# Patient Record
Sex: Male | Born: 1955
Health system: Southern US, Community
[De-identification: ages and names within clinical notes are randomized; demographics above are authoritative.]

## PROBLEM LIST (undated history)

## (undated) DIAGNOSIS — K7460 Unspecified cirrhosis of liver: Secondary | ICD-10-CM

## (undated) DIAGNOSIS — M4302 Spondylolysis, cervical region: Secondary | ICD-10-CM

## (undated) DIAGNOSIS — J449 Chronic obstructive pulmonary disease, unspecified: Secondary | ICD-10-CM

## (undated) DIAGNOSIS — I4891 Unspecified atrial fibrillation: Secondary | ICD-10-CM

## (undated) DIAGNOSIS — G4733 Obstructive sleep apnea (adult) (pediatric): Secondary | ICD-10-CM

## (undated) DIAGNOSIS — I420 Dilated cardiomyopathy: Secondary | ICD-10-CM

## (undated) DIAGNOSIS — I428 Other cardiomyopathies: Secondary | ICD-10-CM

## (undated) DIAGNOSIS — M170 Bilateral primary osteoarthritis of knee: Secondary | ICD-10-CM

## (undated) DIAGNOSIS — I10 Essential (primary) hypertension: Secondary | ICD-10-CM

## (undated) HISTORY — DX: Morbid (severe) obesity due to excess calories: E66.01

## (undated) HISTORY — DX: Chronic obstructive pulmonary disease, unspecified: J44.9

## (undated) HISTORY — DX: Dilated cardiomyopathy: I42.0

## (undated) HISTORY — PX: ICD IMPLANT: EP1208

## (undated) HISTORY — DX: Spondylolysis, cervical region: M43.02

## (undated) HISTORY — DX: Obstructive sleep apnea (adult) (pediatric): G47.33

## (undated) HISTORY — DX: Other cardiomyopathies: I42.8

## (undated) HISTORY — DX: Unspecified atrial fibrillation: I48.91

## (undated) HISTORY — DX: Essential (primary) hypertension: I10

## (undated) HISTORY — DX: Bilateral primary osteoarthritis of knee: M17.0

## (undated) HISTORY — DX: Unspecified cirrhosis of liver: K74.60

---

## 2016-07-20 ENCOUNTER — Institutional Professional Consult (permissible substitution): Payer: Self-pay | Admitting: Emergency Medicine

## 2016-07-21 ENCOUNTER — Encounter: Payer: Self-pay | Admitting: *Deleted

## 2016-07-21 ENCOUNTER — Ambulatory Visit: Payer: Self-pay | Admitting: Cardiovascular Disease

## 2016-08-15 ENCOUNTER — Ambulatory Visit: Payer: Self-pay | Admitting: Cardiovascular Disease

## 2016-08-24 ENCOUNTER — Institutional Professional Consult (permissible substitution): Payer: Self-pay | Admitting: Emergency Medicine

## 2016-09-12 ENCOUNTER — Ambulatory Visit (INDEPENDENT_AMBULATORY_CARE_PROVIDER_SITE_OTHER): Payer: Medicaid Other | Admitting: Cardiovascular Disease

## 2016-09-12 ENCOUNTER — Encounter: Payer: Self-pay | Admitting: Cardiovascular Disease

## 2016-09-12 VITALS — BP 138/98 | HR 78 | Ht 77.0 in | Wt 268.0 lb

## 2016-09-12 DIAGNOSIS — Z720 Tobacco use: Secondary | ICD-10-CM

## 2016-09-12 DIAGNOSIS — Z95810 Presence of automatic (implantable) cardiac defibrillator: Secondary | ICD-10-CM | POA: Diagnosis not present

## 2016-09-12 DIAGNOSIS — I5042 Chronic combined systolic (congestive) and diastolic (congestive) heart failure: Secondary | ICD-10-CM

## 2016-09-12 DIAGNOSIS — I482 Chronic atrial fibrillation, unspecified: Secondary | ICD-10-CM | POA: Insufficient documentation

## 2016-09-12 DIAGNOSIS — I420 Dilated cardiomyopathy: Secondary | ICD-10-CM | POA: Diagnosis not present

## 2016-09-12 DIAGNOSIS — I110 Hypertensive heart disease with heart failure: Secondary | ICD-10-CM | POA: Diagnosis not present

## 2016-09-12 MED ORDER — HYDRALAZINE HCL 25 MG PO TABS: 25.0000 mg | ORAL_TABLET | Freq: Three times a day (TID) | ORAL | 3 refills | Status: DC

## 2016-09-12 NOTE — Patient Instructions (Signed)
Your physician recommends that you schedule a follow-up appointment in July [place]  Your physician has recommended you make the following change in your medication: [place] 25MG  THREE TIMES A DAY   You have been referred to [facility].  Thank you for choosing [facility]!

## 2016-09-12 NOTE — Progress Notes (Signed)
CARDIOLOGY CONSULT NOTE  Patient ID: Vernon Nelson MRN: [id number] DOB/AGE: [birth year] 61 y.o.  Admit date: (Not on file) Primary Physician: Kingsley Callander, FNP Referring Physician: Eulas Post  Reason for Consultation: Atrial fibrillation, dilated cardiomyopathy, chronic systolic heart failure, ICD  HPI: The patient is a 61 year old male who has been referred by his PCP for the evaluation of atrial fibrillation, dilated cardiomyopathy, hypertension, and chronic systolic heart failure.  He has an ICD.  He has chronic exertional dyspnea which appears to be stable which is due to COPD and chronic systolic heart failure. He denies orthopnea and paroxysmal nocturnal dyspnea. He weighs himself daily and he averages 268 lbs. No recent leg swelling.  With regards to atrial fibrillation, he denies palpitations and chest discomfort.   I personally reviewed older ECGs and office visits as well as cardiovascular investigation reports.  Coronary angiography on 11/23/14 demonstrated normal coronary arteries.  Left ventricular systolic function had been severely reduced, LVEF 20-25%. However a follow-up echocardiogram on 06/01/16 demonstrated improvement in LVEF to 30-40%. There was restrictive diastolic physiology with severe left atrial enlargement and mild mitral and tricuspid regurgitation.  Defibrillator was implanted in October 2016.  ECG performed on 05/31/15 which I personally interpreted demonstrated rate controlled atrial fibrillation, 83 bpm.  ICD interrogation on 02/17/16 showed no high ventricular rates. Battery life was stable.  He had been followed at [facility] by Dr. Lavella Hammock.  He tells me he was hospitalized in December 2017 for congestive heart failure at [facility]. I do not have the discharge summary. He said he weighs himself daily and is normally 268 pounds. In December he had gone up to 297 pounds over  the course of 2 days. Since then he has been doing his best to avoid sodium.  He occasionally salts his eggs. He has sleep apnea and uses CPAP.   He has COPD and smokes a half pack of cigarettes daily but says he is trying to quit and was recently prescribed patches.  Denies ICD shocks.  If his weight goes up he takes extra Lasix. He has occasional lightheadedness but denies syncope.    He is planning to go on a cruise with his girlfriend and his brother and sister-in-law to [place] and [place] in May.  ECG which I ordered today and personally interpreted demonstrated rate controlled atrial fibrillation, 71 bpm, with an incomplete right bundle-branch block.  Social history: He is originally from [facility]. He moved here with his wife but then got divorced. He has children and grandchildren who live here in [place]. He then she plans to move back to [facility].   No Known Allergies  Current Outpatient Prescriptions  Medication Sig Dispense Refill  . carvedilol (COREG) 25 MG tablet Take 25 mg by mouth 2 (two) times daily.    . furosemide (LASIX) 20 MG tablet Take 20 mg by mouth at bedtime.    . furosemide (LASIX) 40 MG tablet Take 40 mg by mouth daily.    Marland Kitchen gabapentin (NEURONTIN) 800 MG tablet Take 800 mg by mouth 4 (four) times daily.    Marland Kitchen lisinopril (PRINIVIL,ZESTRIL) 40 MG tablet Take 40 mg by mouth daily.    Marland Kitchen LORazepam (ATIVAN) 1 MG tablet Take 1 mg by mouth 2 (two) times daily as needed for anxiety.    . mirtazapine (REMERON) 30 MG tablet Take 30 mg by mouth at bedtime.    . Oxycodone HCl  10 MG TABS Take 10 mg by mouth.    . oxyCODONE-acetaminophen (PERCOCET) 10-325 MG tablet Take 1 tablet by mouth 4 (four) times daily as needed for pain.    . rivaroxaban (XARELTO) 20 MG TABS tablet Take 20 mg by mouth daily.    Marland Kitchen spironolactone (ALDACTONE) 25 MG tablet Take 25 mg by mouth daily.     No current facility-administered medications for this visit.     Past Medical History:    Diagnosis Date  . Cervical spondylolysis   . Cirrhosis, non-alcoholic ([place])   . Congestive cardiomyopathy ([place])   . Osteoarthritis of both knees     Past Surgical History:  Procedure Laterality Date  . ICD IMPLANT  2015/2016   [facility] [place] DR Tami Ribas     Social History   Social History  . Marital status: Single    Spouse name: N/A  . Number of children: N/A  . Years of education: N/A   Occupational History  . Not on file.   Social History Main Topics  . Smoking status: Current Every Day Smoker    Years: 20.00    Types: Cigarettes  . Smokeless tobacco: Never Used  . Alcohol use Not on file  . Drug use: Unknown  . Sexual activity: Not on file   Other Topics Concern  . Not on file   Social History Narrative  . No narrative on file     No family history of premature CAD in 1st degree relatives.  Prior to Admission medications   Medication Sig Start Date End Date Taking? Authorizing Provider  carvedilol (COREG) 25 MG tablet Take 25 mg by mouth 2 (two) times daily.    Historical Provider, MD  furosemide (LASIX) 20 MG tablet Take 20 mg by mouth at bedtime.    Historical Provider, MD  furosemide (LASIX) 40 MG tablet Take 40 mg by mouth daily.    Historical Provider, MD  gabapentin (NEURONTIN) 800 MG tablet Take 800 mg by mouth 4 (four) times daily.    Historical Provider, MD  lisinopril (PRINIVIL,ZESTRIL) 20 MG tablet Take 20 mg by mouth daily.    Historical Provider, MD  LORazepam (ATIVAN) 1 MG tablet Take 1 mg by mouth 2 (two) times daily as needed for anxiety.    Historical Provider, MD  oxyCODONE-acetaminophen (PERCOCET) 10-325 MG tablet Take 1 tablet by mouth 4 (four) times daily as needed for pain.    Historical Provider, MD  rivaroxaban (XARELTO) 20 MG TABS tablet Take 20 mg by mouth daily.    Historical Provider, MD  spironolactone (ALDACTONE) 25 MG tablet Take 25 mg by mouth daily.    Historical Provider, MD     Review of systems complete and  found to be negative unless listed above in HPI     Physical exam Blood pressure (!) 138/98, pulse 78, height 6\' 5"  (1.956 m), weight 238 lb (108 kg), SpO2 98 %. General: NAD Neck: No JVD, no thyromegaly or thyroid nodule.  Lungs: Clear to auscultation bilaterally with normal respiratory effort. CV: Distant tones. Regular rate and irregular rhythm, normal S1/S2, no S3/S4, no murmur.  No peripheral edema.  No carotid bruit.    Abdomen: Soft, nontender, no hepatosplenomegaly, no distention.  Skin: Intact without lesions or rashes.  Neurologic: Alert and oriented x 3.  Psych: Normal affect. Extremities: No clubbing or cyanosis.  HEENT: Normal.   ECG: Most recent ECG reviewed.  Telemetry: Independently reviewed.  Labs:  No results found for: WBC, HGB,  HCT, MCV, PLT No results for input(s): NA, K, CL, CO2, BUN, CREATININE, CALCIUM, PROT, BILITOT, ALKPHOS, ALT, AST, GLUCOSE in the last 168 hours.  Invalid input(s): LABALBU No results found for: CKTOTAL, CKMB, CKMBINDEX, TROPONINI No results found for: CHOL No results found for: HDL No results found for: LDLCALC No results found for: TRIG No results found for: CHOLHDL No results found for: LDLDIRECT       Studies: No results found.  ASSESSMENT AND PLAN:  1. Chronic systolic heart failure (dilated cardiomyopathy), LVEF 30-40%: Euvolemic and stable. Continue carvedilol 25 mg twice daily and lisinopril 40 mg daily. Also continue Lasix 40 mg every morning and 20 mg every evening along with spironolactone 25 mg daily. I will order records for review regarding his hospitalization for CHF in December 2017.  2. Hypertensive heart disease: Blood pressure is markedly elevated. I will start hydralazine 25 mg three times daily. Encouraged to avoid sodium.  3. Chronic atrial fibrillation: Symptomatically stable. Continue Xarelto 20 mg daily for anticoagulation. Continue carvedilol 25 mg twice daily for rate control. ECG reviewed above.  4.  Defibrillator: Stable. I will enroll in device clinic for routine surveillance monitoring. Most recent interrogation reviewed above with ICD found to be stable with normal battery life.  5. Sleep apnea: Uses CPAP.  6. Tobacco abuse: Trying to quit. Using patches now.   Dispo: fu July 2018.  Signed: Kate Sable, M.D., F.A.C.C.  09/12/2016, 1:52 PM

## 2016-09-14 ENCOUNTER — Institutional Professional Consult (permissible substitution): Payer: Self-pay | Admitting: Pulmonary Disease

## 2016-09-19 ENCOUNTER — Encounter: Payer: Self-pay | Admitting: Internal Medicine

## 2016-10-04 ENCOUNTER — Encounter: Payer: Medicaid Other | Admitting: Internal Medicine

## 2016-10-23 ENCOUNTER — Institutional Professional Consult (permissible substitution): Payer: Self-pay | Admitting: Internal Medicine

## 2016-11-20 ENCOUNTER — Encounter: Payer: Medicaid Other | Admitting: Internal Medicine

## 2016-11-20 ENCOUNTER — Institutional Professional Consult (permissible substitution): Payer: Self-pay | Admitting: Internal Medicine

## 2016-11-21 ENCOUNTER — Institutional Professional Consult (permissible substitution): Payer: Self-pay | Admitting: Internal Medicine

## 2016-11-30 ENCOUNTER — Encounter: Payer: Medicaid Other | Admitting: Internal Medicine

## 2016-12-13 ENCOUNTER — Ambulatory Visit (INDEPENDENT_AMBULATORY_CARE_PROVIDER_SITE_OTHER): Payer: Medicaid Other | Admitting: Cardiovascular Disease

## 2016-12-13 ENCOUNTER — Encounter: Payer: Self-pay | Admitting: Cardiovascular Disease

## 2016-12-13 VITALS — BP 138/100 | HR 60 | Ht 77.0 in | Wt 274.0 lb

## 2016-12-13 DIAGNOSIS — I420 Dilated cardiomyopathy: Secondary | ICD-10-CM | POA: Diagnosis not present

## 2016-12-13 DIAGNOSIS — Z720 Tobacco use: Secondary | ICD-10-CM

## 2016-12-13 DIAGNOSIS — I482 Chronic atrial fibrillation, unspecified: Secondary | ICD-10-CM

## 2016-12-13 DIAGNOSIS — I5042 Chronic combined systolic (congestive) and diastolic (congestive) heart failure: Secondary | ICD-10-CM

## 2016-12-13 DIAGNOSIS — Z95810 Presence of automatic (implantable) cardiac defibrillator: Secondary | ICD-10-CM | POA: Diagnosis not present

## 2016-12-13 DIAGNOSIS — I110 Hypertensive heart disease with heart failure: Secondary | ICD-10-CM | POA: Diagnosis not present

## 2016-12-13 MED ORDER — HYDRALAZINE HCL 50 MG PO TABS: 50.0000 mg | ORAL_TABLET | Freq: Three times a day (TID) | ORAL | 3 refills | Status: DC

## 2016-12-13 NOTE — Patient Instructions (Signed)
Medication Instructions:  Your physician has recommended you make the following change in your medication:  Begin taking hydralazine 50 mg Three times a day   Labwork: NONE  Testing/Procedures: NONE  Follow-Up: Your physician wants you to follow-up in: [place]. [place]. You will receive a reminder letter in the mail two months in advance. If you don't receive a letter, please call our office to schedule the follow-up appointment.  Any Other Special Instructions Will Be Listed Below (If Applicable).  If you need a refill on your cardiac medications before your next appointment, please call your pharmacy.

## 2016-12-13 NOTE — Progress Notes (Signed)
SUBJECTIVE: The patient returns for follow-up of dilated cardiomyopathy and chronic systolic heart failure with hypertensive heart disease. He also has chronic atrial fibrillation and has a defibrillator.  He recently returned from an [place]. He was eating a lot of sodium rich foods and developed leg swelling and had to take extra Lasix.  Today he denies chest pain, dizziness, shortness of breath, orthopnea, and PND. He denies ICD shocks.  He sometimes forgets to take hydralazine. Blood pressure is 138/100 today.     Social history: He is originally from [facility]. He moved here with his wife but then got divorced. He has children and grandchildren who live here in [place]. He is considering moving back to [facility].  Review of Systems: As per "subjective", otherwise negative.  No Known Allergies  Current Outpatient Prescriptions  Medication Sig Dispense Refill  . carvedilol (COREG) 25 MG tablet Take 25 mg by mouth 2 (two) times daily.    . diazepam (VALIUM) 10 MG tablet Take 10 mg by mouth 2 (two) times daily.    . DULoxetine (CYMBALTA) 30 MG capsule Take 30 mg by mouth 2 (two) times daily.    . furosemide (LASIX) 20 MG tablet Take 20 mg by mouth at bedtime.    . furosemide (LASIX) 40 MG tablet Take 40 mg by mouth daily.    Marland Kitchen gabapentin (NEURONTIN) 600 MG tablet Take 600 mg by mouth 3 (three) times daily.    Marland Kitchen lisinopril (PRINIVIL,ZESTRIL) 40 MG tablet Take 40 mg by mouth daily.    . mirtazapine (REMERON) 30 MG tablet Take 30 mg by mouth at bedtime.    . Oxycodone HCl 10 MG TABS Take 10 mg by mouth.    . oxyCODONE-acetaminophen (PERCOCET) 10-325 MG tablet Take 1 tablet by mouth 4 (four) times daily as needed for pain.    . rivaroxaban (XARELTO) 20 MG TABS tablet Take 20 mg by mouth daily.    Marland Kitchen spironolactone (ALDACTONE) 25 MG tablet Take 25 mg by mouth daily.    . hydrALAZINE (APRESOLINE) 25 MG tablet Take 1 tablet (25 mg total) by mouth 3 (three) times daily.  270 tablet 3   No current facility-administered medications for this visit.     Past Medical History:  Diagnosis Date  . Cervical spondylolysis   . Cirrhosis, non-alcoholic ([place])   . Congestive cardiomyopathy ([place])   . Osteoarthritis of both knees     Past Surgical History:  Procedure Laterality Date  . ICD IMPLANT  2015/2016   [facility] [place] DR Tami Ribas     Social History   Social History  . Marital status: Single    Spouse name: N/A  . Number of children: N/A  . Years of education: N/A   Occupational History  . Not on file.   Social History Main Topics  . Smoking status: Current Every Day Smoker    Years: 20.00    Types: Cigarettes  . Smokeless tobacco: Never Used  . Alcohol use No  . Drug use: No  . Sexual activity: Not on file   Other Topics Concern  . Not on file   Social History Narrative  . No narrative on file     Vitals:   12/13/16 1508  BP: (!) 138/100  Pulse: 60  SpO2: 97%  Weight: 274 lb (124.3 kg)  Height: 6\' 5"  (1.956 m)    Wt Readings from Last 3 Encounters:  12/13/16 274 lb (124.3 kg)  09/12/16  268 lb (121.6 kg)     PHYSICAL EXAM General: NAD HEENT: Normal. Neck: No JVD, no thyromegaly. Lungs: Clear to auscultation bilaterally with normal respiratory effort. CV: Nondisplaced PMI.  Regular rate and rhythm, normal S1/S2, no S3/S4, no murmur. No pretibial or periankle edema.  No carotid bruit.   Abdomen: Soft, protuberant.  Neurologic: Alert and oriented.  Psych: Normal affect. Skin: Normal. Musculoskeletal: No gross deformities.    ECG: Most recent ECG reviewed.   Labs: No results found for: K, BUN, CREATININE, ALT, TSH, HGB   Lipids: No results found for: LDLCALC, LDLDIRECT, CHOL, TRIG, HDL     ASSESSMENT AND PLAN: 1. Chronic systolic heart failure (dilated cardiomyopathy), LVEF 30-40%: Euvolemic. Continue carvedilol, lisinopril, spironolactone, hydralazine (increase to 50 mg tid), and Lasix. I will aim to  control blood pressure.  2. Hypertensive heart disease: Remains elevated. I will increase hydralazine to 50 mg three times daily. Blood pressure is markedly elevated. I will start hydralazine 25 mg three times daily. Encouraged to avoid sodium.  3. Chronic atrial fibrillation: Symptomatically stable on carvedilol. Continue Xarelto.  4. Defibrillator: No ICD shocks. Follows with Dr. Rayann Heman.  5. Sleep apnea: Uses CPAP.  6. Tobacco abuse: Trying to quit. Using patches now.    Disposition: Follow up 4 months  Kate Sable, M.D., F.A.C.C.

## 2017-02-07 DIAGNOSIS — M545 Low back pain: Secondary | ICD-10-CM | POA: Diagnosis not present

## 2017-02-07 DIAGNOSIS — M130 Polyarthritis, unspecified: Secondary | ICD-10-CM | POA: Diagnosis not present

## 2017-02-07 DIAGNOSIS — G4733 Obstructive sleep apnea (adult) (pediatric): Secondary | ICD-10-CM | POA: Diagnosis not present

## 2017-02-07 DIAGNOSIS — Z79891 Long term (current) use of opiate analgesic: Secondary | ICD-10-CM | POA: Diagnosis not present

## 2017-02-07 DIAGNOSIS — G4701 Insomnia due to medical condition: Secondary | ICD-10-CM | POA: Diagnosis not present

## 2017-02-07 DIAGNOSIS — M542 Cervicalgia: Secondary | ICD-10-CM | POA: Diagnosis not present

## 2017-02-07 DIAGNOSIS — I482 Chronic atrial fibrillation: Secondary | ICD-10-CM | POA: Diagnosis not present

## 2017-02-07 DIAGNOSIS — I5022 Chronic systolic (congestive) heart failure: Secondary | ICD-10-CM | POA: Diagnosis not present

## 2017-02-21 ENCOUNTER — Encounter: Payer: Self-pay | Admitting: Internal Medicine

## 2017-02-22 ENCOUNTER — Encounter: Payer: Self-pay | Admitting: Internal Medicine

## 2017-03-06 DIAGNOSIS — I5022 Chronic systolic (congestive) heart failure: Secondary | ICD-10-CM | POA: Diagnosis not present

## 2017-03-06 DIAGNOSIS — F5221 Male erectile disorder: Secondary | ICD-10-CM | POA: Diagnosis not present

## 2017-03-06 DIAGNOSIS — M542 Cervicalgia: Secondary | ICD-10-CM | POA: Diagnosis not present

## 2017-03-06 DIAGNOSIS — G4733 Obstructive sleep apnea (adult) (pediatric): Secondary | ICD-10-CM | POA: Diagnosis not present

## 2017-03-06 DIAGNOSIS — M130 Polyarthritis, unspecified: Secondary | ICD-10-CM | POA: Diagnosis not present

## 2017-03-06 DIAGNOSIS — M545 Low back pain: Secondary | ICD-10-CM | POA: Diagnosis not present

## 2017-03-06 DIAGNOSIS — I482 Chronic atrial fibrillation: Secondary | ICD-10-CM | POA: Diagnosis not present

## 2017-03-06 DIAGNOSIS — G4701 Insomnia due to medical condition: Secondary | ICD-10-CM | POA: Diagnosis not present

## 2017-03-21 ENCOUNTER — Ambulatory Visit: Payer: Self-pay | Admitting: Physician Assistant

## 2017-03-21 DIAGNOSIS — Z23 Encounter for immunization: Secondary | ICD-10-CM | POA: Diagnosis not present

## 2017-04-04 DIAGNOSIS — M130 Polyarthritis, unspecified: Secondary | ICD-10-CM | POA: Diagnosis not present

## 2017-04-04 DIAGNOSIS — M542 Cervicalgia: Secondary | ICD-10-CM | POA: Diagnosis not present

## 2017-04-04 DIAGNOSIS — M545 Low back pain: Secondary | ICD-10-CM | POA: Diagnosis not present

## 2017-04-04 DIAGNOSIS — G4701 Insomnia due to medical condition: Secondary | ICD-10-CM | POA: Diagnosis not present

## 2017-04-17 ENCOUNTER — Ambulatory Visit: Payer: Medicaid Other | Admitting: Cardiovascular Disease

## 2017-04-19 DIAGNOSIS — R221 Localized swelling, mass and lump, neck: Secondary | ICD-10-CM | POA: Diagnosis not present

## 2017-04-19 DIAGNOSIS — I5022 Chronic systolic (congestive) heart failure: Secondary | ICD-10-CM | POA: Diagnosis not present

## 2017-04-19 DIAGNOSIS — F5221 Male erectile disorder: Secondary | ICD-10-CM | POA: Diagnosis not present

## 2017-04-23 DIAGNOSIS — R221 Localized swelling, mass and lump, neck: Secondary | ICD-10-CM | POA: Diagnosis not present

## 2017-04-23 DIAGNOSIS — I10 Essential (primary) hypertension: Secondary | ICD-10-CM | POA: Diagnosis not present

## 2017-05-30 DIAGNOSIS — M542 Cervicalgia: Secondary | ICD-10-CM | POA: Diagnosis not present

## 2017-05-30 DIAGNOSIS — Z79899 Other long term (current) drug therapy: Secondary | ICD-10-CM | POA: Diagnosis not present

## 2017-05-30 DIAGNOSIS — G894 Chronic pain syndrome: Secondary | ICD-10-CM | POA: Diagnosis not present

## 2017-05-30 DIAGNOSIS — M545 Low back pain: Secondary | ICD-10-CM | POA: Diagnosis not present

## 2017-05-30 DIAGNOSIS — Z79891 Long term (current) use of opiate analgesic: Secondary | ICD-10-CM | POA: Diagnosis not present

## 2017-05-30 DIAGNOSIS — M130 Polyarthritis, unspecified: Secondary | ICD-10-CM | POA: Diagnosis not present

## 2017-06-06 ENCOUNTER — Ambulatory Visit: Payer: Self-pay | Admitting: Cardiovascular Disease

## 2017-07-30 DIAGNOSIS — M542 Cervicalgia: Secondary | ICD-10-CM | POA: Diagnosis not present

## 2017-07-30 DIAGNOSIS — G894 Chronic pain syndrome: Secondary | ICD-10-CM | POA: Diagnosis not present

## 2017-07-30 DIAGNOSIS — M130 Polyarthritis, unspecified: Secondary | ICD-10-CM | POA: Diagnosis not present

## 2017-07-30 DIAGNOSIS — M545 Low back pain: Secondary | ICD-10-CM | POA: Diagnosis not present

## 2017-08-02 DIAGNOSIS — I110 Hypertensive heart disease with heart failure: Secondary | ICD-10-CM | POA: Diagnosis not present

## 2017-08-02 DIAGNOSIS — I5022 Chronic systolic (congestive) heart failure: Secondary | ICD-10-CM | POA: Diagnosis not present

## 2017-08-02 DIAGNOSIS — E1121 Type 2 diabetes mellitus with diabetic nephropathy: Secondary | ICD-10-CM | POA: Diagnosis not present

## 2017-08-02 DIAGNOSIS — R3911 Hesitancy of micturition: Secondary | ICD-10-CM | POA: Diagnosis not present

## 2017-08-24 ENCOUNTER — Ambulatory Visit: Payer: Self-pay | Admitting: Cardiovascular Disease

## 2017-09-18 ENCOUNTER — Other Ambulatory Visit: Payer: Self-pay | Admitting: Cardiovascular Disease

## 2017-09-26 DIAGNOSIS — M542 Cervicalgia: Secondary | ICD-10-CM | POA: Diagnosis not present

## 2017-09-26 DIAGNOSIS — M545 Low back pain: Secondary | ICD-10-CM | POA: Diagnosis not present

## 2017-09-26 DIAGNOSIS — M130 Polyarthritis, unspecified: Secondary | ICD-10-CM | POA: Diagnosis not present

## 2017-09-26 DIAGNOSIS — G894 Chronic pain syndrome: Secondary | ICD-10-CM | POA: Diagnosis not present

## 2017-10-26 ENCOUNTER — Ambulatory Visit: Payer: Self-pay | Admitting: Cardiovascular Disease

## 2017-11-08 ENCOUNTER — Other Ambulatory Visit: Payer: Self-pay | Admitting: Cardiovascular Disease

## 2017-11-08 DIAGNOSIS — I509 Heart failure, unspecified: Secondary | ICD-10-CM | POA: Diagnosis not present

## 2017-11-08 DIAGNOSIS — M545 Low back pain: Secondary | ICD-10-CM | POA: Diagnosis not present

## 2017-11-08 DIAGNOSIS — J449 Chronic obstructive pulmonary disease, unspecified: Secondary | ICD-10-CM | POA: Diagnosis not present

## 2017-11-08 DIAGNOSIS — I10 Essential (primary) hypertension: Secondary | ICD-10-CM | POA: Diagnosis not present

## 2017-11-21 DIAGNOSIS — M545 Low back pain: Secondary | ICD-10-CM | POA: Diagnosis not present

## 2017-11-21 DIAGNOSIS — Z79891 Long term (current) use of opiate analgesic: Secondary | ICD-10-CM | POA: Diagnosis not present

## 2017-11-21 DIAGNOSIS — G894 Chronic pain syndrome: Secondary | ICD-10-CM | POA: Diagnosis not present

## 2017-11-21 DIAGNOSIS — M542 Cervicalgia: Secondary | ICD-10-CM | POA: Diagnosis not present

## 2017-11-21 DIAGNOSIS — M130 Polyarthritis, unspecified: Secondary | ICD-10-CM | POA: Diagnosis not present

## 2017-12-03 DIAGNOSIS — M25562 Pain in left knee: Secondary | ICD-10-CM | POA: Diagnosis not present

## 2017-12-03 DIAGNOSIS — L304 Erythema intertrigo: Secondary | ICD-10-CM | POA: Diagnosis not present

## 2017-12-03 DIAGNOSIS — J449 Chronic obstructive pulmonary disease, unspecified: Secondary | ICD-10-CM | POA: Diagnosis not present

## 2017-12-03 DIAGNOSIS — M25561 Pain in right knee: Secondary | ICD-10-CM | POA: Diagnosis not present

## 2017-12-25 DIAGNOSIS — M170 Bilateral primary osteoarthritis of knee: Secondary | ICD-10-CM | POA: Diagnosis not present

## 2018-01-01 ENCOUNTER — Ambulatory Visit: Payer: Self-pay | Admitting: Cardiovascular Disease

## 2018-01-23 ENCOUNTER — Ambulatory Visit: Payer: Self-pay | Admitting: Cardiovascular Disease

## 2018-02-19 DIAGNOSIS — Z79891 Long term (current) use of opiate analgesic: Secondary | ICD-10-CM | POA: Diagnosis not present

## 2018-02-19 DIAGNOSIS — M545 Low back pain: Secondary | ICD-10-CM | POA: Diagnosis not present

## 2018-02-19 DIAGNOSIS — G894 Chronic pain syndrome: Secondary | ICD-10-CM | POA: Diagnosis not present

## 2018-02-19 DIAGNOSIS — M130 Polyarthritis, unspecified: Secondary | ICD-10-CM | POA: Diagnosis not present

## 2018-02-19 DIAGNOSIS — M542 Cervicalgia: Secondary | ICD-10-CM | POA: Diagnosis not present

## 2018-03-19 DIAGNOSIS — Z23 Encounter for immunization: Secondary | ICD-10-CM | POA: Diagnosis not present

## 2018-03-20 DIAGNOSIS — M545 Low back pain: Secondary | ICD-10-CM | POA: Diagnosis not present

## 2018-03-20 DIAGNOSIS — G894 Chronic pain syndrome: Secondary | ICD-10-CM | POA: Diagnosis not present

## 2018-03-20 DIAGNOSIS — M130 Polyarthritis, unspecified: Secondary | ICD-10-CM | POA: Diagnosis not present

## 2018-03-20 DIAGNOSIS — M542 Cervicalgia: Secondary | ICD-10-CM | POA: Diagnosis not present

## 2018-03-28 ENCOUNTER — Ambulatory Visit: Payer: Self-pay | Admitting: Cardiovascular Disease

## 2018-04-16 ENCOUNTER — Other Ambulatory Visit: Payer: Self-pay | Admitting: Cardiovascular Disease

## 2018-05-13 ENCOUNTER — Other Ambulatory Visit: Payer: Self-pay | Admitting: Cardiovascular Disease

## 2018-05-14 ENCOUNTER — Other Ambulatory Visit: Payer: Self-pay | Admitting: Cardiovascular Disease

## 2018-05-14 DIAGNOSIS — M542 Cervicalgia: Secondary | ICD-10-CM | POA: Diagnosis not present

## 2018-05-14 DIAGNOSIS — M545 Low back pain: Secondary | ICD-10-CM | POA: Diagnosis not present

## 2018-05-14 DIAGNOSIS — G894 Chronic pain syndrome: Secondary | ICD-10-CM | POA: Diagnosis not present

## 2018-05-14 DIAGNOSIS — M130 Polyarthritis, unspecified: Secondary | ICD-10-CM | POA: Diagnosis not present

## 2018-07-09 DIAGNOSIS — M542 Cervicalgia: Secondary | ICD-10-CM | POA: Diagnosis not present

## 2018-07-09 DIAGNOSIS — G894 Chronic pain syndrome: Secondary | ICD-10-CM | POA: Diagnosis not present

## 2018-07-09 DIAGNOSIS — M545 Low back pain: Secondary | ICD-10-CM | POA: Diagnosis not present

## 2018-07-09 DIAGNOSIS — M130 Polyarthritis, unspecified: Secondary | ICD-10-CM | POA: Diagnosis not present

## 2018-07-18 DIAGNOSIS — R600 Localized edema: Secondary | ICD-10-CM | POA: Diagnosis not present

## 2018-07-18 DIAGNOSIS — R0602 Shortness of breath: Secondary | ICD-10-CM | POA: Diagnosis not present

## 2018-07-18 DIAGNOSIS — Z6836 Body mass index (BMI) 36.0-36.9, adult: Secondary | ICD-10-CM | POA: Diagnosis not present

## 2018-07-18 DIAGNOSIS — Z125 Encounter for screening for malignant neoplasm of prostate: Secondary | ICD-10-CM | POA: Diagnosis not present

## 2018-07-18 DIAGNOSIS — Z8619 Personal history of other infectious and parasitic diseases: Secondary | ICD-10-CM | POA: Diagnosis not present

## 2018-07-18 DIAGNOSIS — R200 Anesthesia of skin: Secondary | ICD-10-CM | POA: Diagnosis not present

## 2018-07-18 DIAGNOSIS — J449 Chronic obstructive pulmonary disease, unspecified: Secondary | ICD-10-CM | POA: Diagnosis not present

## 2018-07-18 DIAGNOSIS — Z7689 Persons encountering health services in other specified circumstances: Secondary | ICD-10-CM | POA: Diagnosis not present

## 2018-07-18 DIAGNOSIS — I5022 Chronic systolic (congestive) heart failure: Secondary | ICD-10-CM | POA: Diagnosis not present

## 2018-07-18 DIAGNOSIS — E1159 Type 2 diabetes mellitus with other circulatory complications: Secondary | ICD-10-CM | POA: Diagnosis not present

## 2018-07-18 DIAGNOSIS — Z5320 Procedure and treatment not carried out because of patient's decision for unspecified reasons: Secondary | ICD-10-CM | POA: Diagnosis not present

## 2018-07-18 DIAGNOSIS — I10 Essential (primary) hypertension: Secondary | ICD-10-CM | POA: Diagnosis not present

## 2018-07-23 DIAGNOSIS — M545 Low back pain: Secondary | ICD-10-CM | POA: Diagnosis not present

## 2018-07-23 DIAGNOSIS — E559 Vitamin D deficiency, unspecified: Secondary | ICD-10-CM | POA: Diagnosis not present

## 2018-07-23 DIAGNOSIS — D474 Osteomyelofibrosis: Secondary | ICD-10-CM | POA: Diagnosis not present

## 2018-07-23 DIAGNOSIS — M179 Osteoarthritis of knee, unspecified: Secondary | ICD-10-CM | POA: Diagnosis not present

## 2018-07-23 DIAGNOSIS — M542 Cervicalgia: Secondary | ICD-10-CM | POA: Diagnosis not present

## 2018-07-23 DIAGNOSIS — Z79891 Long term (current) use of opiate analgesic: Secondary | ICD-10-CM | POA: Diagnosis not present

## 2018-07-23 DIAGNOSIS — G894 Chronic pain syndrome: Secondary | ICD-10-CM | POA: Diagnosis not present

## 2018-07-23 DIAGNOSIS — M130 Polyarthritis, unspecified: Secondary | ICD-10-CM | POA: Diagnosis not present

## 2018-07-23 DIAGNOSIS — G602 Neuropathy in association with hereditary ataxia: Secondary | ICD-10-CM | POA: Diagnosis not present

## 2018-08-06 DIAGNOSIS — G894 Chronic pain syndrome: Secondary | ICD-10-CM | POA: Diagnosis not present

## 2018-08-06 DIAGNOSIS — M130 Polyarthritis, unspecified: Secondary | ICD-10-CM | POA: Diagnosis not present

## 2018-08-06 DIAGNOSIS — M542 Cervicalgia: Secondary | ICD-10-CM | POA: Diagnosis not present

## 2018-08-06 DIAGNOSIS — M545 Low back pain: Secondary | ICD-10-CM | POA: Diagnosis not present

## 2018-08-06 DIAGNOSIS — Z79891 Long term (current) use of opiate analgesic: Secondary | ICD-10-CM | POA: Diagnosis not present

## 2018-08-20 ENCOUNTER — Other Ambulatory Visit: Payer: Self-pay | Admitting: Cardiovascular Disease

## 2018-08-20 DIAGNOSIS — Z79891 Long term (current) use of opiate analgesic: Secondary | ICD-10-CM | POA: Diagnosis not present

## 2018-08-20 DIAGNOSIS — M542 Cervicalgia: Secondary | ICD-10-CM | POA: Diagnosis not present

## 2018-08-20 DIAGNOSIS — M545 Low back pain: Secondary | ICD-10-CM | POA: Diagnosis not present

## 2018-08-20 DIAGNOSIS — G894 Chronic pain syndrome: Secondary | ICD-10-CM | POA: Diagnosis not present

## 2018-08-20 DIAGNOSIS — M130 Polyarthritis, unspecified: Secondary | ICD-10-CM | POA: Diagnosis not present

## 2018-08-29 ENCOUNTER — Ambulatory Visit (HOSPITAL_COMMUNITY)
Admission: RE | Admit: 2018-08-29 | Discharge: 2018-08-29 | Disposition: A | Discharge status: Home / Self Care | Payer: Medicare Other | Source: Ambulatory Visit | Attending: Pulmonary Disease | Admitting: Pulmonary Disease

## 2018-08-29 ENCOUNTER — Other Ambulatory Visit: Payer: Self-pay

## 2018-08-29 ENCOUNTER — Other Ambulatory Visit (HOSPITAL_COMMUNITY): Payer: Self-pay | Admitting: Pulmonary Disease

## 2018-08-29 DIAGNOSIS — J449 Chronic obstructive pulmonary disease, unspecified: Secondary | ICD-10-CM

## 2018-08-29 DIAGNOSIS — J9611 Chronic respiratory failure with hypoxia: Secondary | ICD-10-CM | POA: Diagnosis not present

## 2018-08-29 DIAGNOSIS — I10 Essential (primary) hypertension: Secondary | ICD-10-CM | POA: Diagnosis not present

## 2018-08-29 DIAGNOSIS — R0602 Shortness of breath: Secondary | ICD-10-CM | POA: Diagnosis not present

## 2018-08-29 DIAGNOSIS — G4733 Obstructive sleep apnea (adult) (pediatric): Secondary | ICD-10-CM | POA: Diagnosis not present

## 2018-09-17 DIAGNOSIS — M545 Low back pain: Secondary | ICD-10-CM | POA: Diagnosis not present

## 2018-09-17 DIAGNOSIS — Z79891 Long term (current) use of opiate analgesic: Secondary | ICD-10-CM | POA: Diagnosis not present

## 2018-09-17 DIAGNOSIS — M542 Cervicalgia: Secondary | ICD-10-CM | POA: Diagnosis not present

## 2018-09-17 DIAGNOSIS — G894 Chronic pain syndrome: Secondary | ICD-10-CM | POA: Diagnosis not present

## 2018-09-17 DIAGNOSIS — M130 Polyarthritis, unspecified: Secondary | ICD-10-CM | POA: Diagnosis not present

## 2018-09-25 ENCOUNTER — Ambulatory Visit: Payer: Medicare Other | Admitting: Cardiovascular Disease

## 2018-09-26 ENCOUNTER — Telehealth: Payer: Self-pay | Admitting: Cardiovascular Disease

## 2018-09-26 NOTE — Telephone Encounter (Signed)
Virtual Visit Pre-Appointment Phone Call  Steps For Call:  1. Confirm consent - "In the setting of the current Covid19 crisis, you are scheduled for a (phone or video) visit with your provider on (date) at (time).  Just as we do with many in-office visits, in order for you to participate in this visit, we must obtain consent.  If you'd like, I can send this to your mychart (if signed up) or email for you to review.  Otherwise, I can obtain your verbal consent now.  All virtual visits are billed to your insurance company just like a normal visit would be.  By agreeing to a virtual visit, we'd like you to understand that the technology does not allow for your provider to perform an examination, and thus may limit your provider's ability to fully assess your condition.  Finally, though the technology is pretty good, we cannot assure that it will always work on either your or our end, and in the setting of a video visit, we may have to convert it to a phone-only visit.  In either situation, we cannot ensure that we have a secure connection.  Are you willing to proceed?"  2. Give patient instructions for WebEx download to smartphone as below if video visit  3. Advise patient to be prepared with any vital sign or heart rhythm information, their current medicines, and a piece of paper and pen handy for any instructions they may receive the day of their visit  4. Inform patient they will receive a phone call 15 minutes prior to their appointment time (may be from unknown caller ID) so they should be prepared to answer  5. Confirm that appointment type is correct in Epic appointment notes (video vs telephone)    TELEPHONE CALL NOTE  Vernon Nelson has been deemed a candidate for a follow-up tele-health visit to limit community exposure during the Covid-19 pandemic. I spoke with the patient via phone to ensure availability of phone/video source, confirm preferred email & phone number, and discuss  instructions and expectations.  I reminded Vernon Nelson to be prepared with any vital sign and/or heart rhythm information that could potentially be obtained via home monitoring, at the time of his visit. I reminded Vernon Nelson to expect a phone call at the time of his visit if his visit.  Did the patient verbally acknowledge consent to treatment? Yes   Chanda Busing 09/26/2018 2:17 PM   DOWNLOADING THE [place]  - If Apple, go to [employer] and type in WebEx in the search bar. [place] [employer], the blue/green circle. The app is free but as with any other app downloads, their phone may require them to verify saved payment information or Apple password. The patient does NOT have to create an account.  - If Android, ask patient to go to [employer] and type in WebEx in the search bar. [place] [employer], the blue/green circle. The app is free but as with any other app downloads, their phone may require them to verify saved payment information or Android password. The patient does NOT have to create an account.   CONSENT FOR TELE-HEALTH VISIT - PLEASE REVIEW  I hereby voluntarily request, consent and authorize [facility] and its employed or contracted physicians, physician assistants, nurse practitioners or other licensed health care professionals (the Practitioner), to provide me with telemedicine health care services (the Services") as deemed necessary by the treating Practitioner. I acknowledge and  consent to receive the Services by the Practitioner via telemedicine. I understand that the telemedicine visit will involve communicating with the Practitioner through live audiovisual communication technology and the disclosure of certain medical information by electronic transmission. I acknowledge that I have been given the opportunity to request an in-person assessment or other available alternative prior to the telemedicine visit and am  voluntarily participating in the telemedicine visit.  I understand that I have the right to withhold or withdraw my consent to the use of telemedicine in the course of my care at any time, without affecting my right to future care or treatment, and that the Practitioner or I may terminate the telemedicine visit at any time. I understand that I have the right to inspect all information obtained and/or recorded in the course of the telemedicine visit and may receive copies of available information for a reasonable fee.  I understand that some of the potential risks of receiving the Services via telemedicine include:   Delay or interruption in medical evaluation due to technological equipment failure or disruption;  Information transmitted may not be sufficient (e.g. poor resolution of images) to allow for appropriate medical decision making by the Practitioner; and/or   In rare instances, security protocols could fail, causing a breach of personal health information.  Furthermore, I acknowledge that it is my responsibility to provide information about my medical history, conditions and care that is complete and accurate to the best of my ability. I acknowledge that Practitioner's advice, recommendations, and/or decision may be based on factors not within their control, such as incomplete or inaccurate data provided by me or distortions of diagnostic images or specimens that may result from electronic transmissions. I understand that the practice of medicine is not an exact science and that Practitioner makes no warranties or guarantees regarding treatment outcomes. I acknowledge that I will receive a copy of this consent concurrently upon execution via email to the email address I last provided but may also request a printed copy by calling the office of [facility].    I understand that my insurance will be billed for this visit.   I have read or had this consent read to me.  I understand the  contents of this consent, which adequately explains the benefits and risks of the Services being provided via telemedicine.   I have been provided ample opportunity to ask questions regarding this consent and the Services and have had my questions answered to my satisfaction.  I give my informed consent for the services to be provided through the use of telemedicine in my medical care  By participating in this telemedicine visit I agree to the above.

## 2018-09-27 ENCOUNTER — Encounter: Payer: Self-pay | Admitting: Cardiovascular Disease

## 2018-09-27 ENCOUNTER — Telehealth: Payer: Medicare Other | Admitting: Cardiovascular Disease

## 2018-09-27 DIAGNOSIS — J449 Chronic obstructive pulmonary disease, unspecified: Secondary | ICD-10-CM | POA: Diagnosis not present

## 2018-09-27 DIAGNOSIS — G4733 Obstructive sleep apnea (adult) (pediatric): Secondary | ICD-10-CM | POA: Diagnosis not present

## 2018-09-27 DIAGNOSIS — I5020 Unspecified systolic (congestive) heart failure: Secondary | ICD-10-CM | POA: Diagnosis not present

## 2018-10-15 DIAGNOSIS — M130 Polyarthritis, unspecified: Secondary | ICD-10-CM | POA: Diagnosis not present

## 2018-10-15 DIAGNOSIS — G894 Chronic pain syndrome: Secondary | ICD-10-CM | POA: Diagnosis not present

## 2018-10-15 DIAGNOSIS — M542 Cervicalgia: Secondary | ICD-10-CM | POA: Diagnosis not present

## 2018-10-15 DIAGNOSIS — M545 Low back pain: Secondary | ICD-10-CM | POA: Diagnosis not present

## 2018-10-17 DIAGNOSIS — I10 Essential (primary) hypertension: Secondary | ICD-10-CM | POA: Diagnosis not present

## 2018-10-17 DIAGNOSIS — J302 Other seasonal allergic rhinitis: Secondary | ICD-10-CM | POA: Diagnosis not present

## 2018-11-05 DIAGNOSIS — G894 Chronic pain syndrome: Secondary | ICD-10-CM | POA: Diagnosis not present

## 2018-11-05 DIAGNOSIS — M545 Low back pain: Secondary | ICD-10-CM | POA: Diagnosis not present

## 2018-11-05 DIAGNOSIS — M130 Polyarthritis, unspecified: Secondary | ICD-10-CM | POA: Diagnosis not present

## 2018-11-05 DIAGNOSIS — M542 Cervicalgia: Secondary | ICD-10-CM | POA: Diagnosis not present

## 2018-11-14 DIAGNOSIS — I509 Heart failure, unspecified: Secondary | ICD-10-CM | POA: Diagnosis not present

## 2018-11-14 DIAGNOSIS — J9611 Chronic respiratory failure with hypoxia: Secondary | ICD-10-CM | POA: Diagnosis not present

## 2018-11-14 DIAGNOSIS — J449 Chronic obstructive pulmonary disease, unspecified: Secondary | ICD-10-CM | POA: Diagnosis not present

## 2018-11-14 DIAGNOSIS — I482 Chronic atrial fibrillation, unspecified: Secondary | ICD-10-CM | POA: Diagnosis not present

## 2018-12-10 ENCOUNTER — Telehealth: Payer: Self-pay | Admitting: Student

## 2018-12-10 NOTE — Telephone Encounter (Signed)
Virtual Visit Pre-Appointment Phone Call  "(Name), I am calling you today to discuss your upcoming appointment. We are currently trying to limit exposure to the virus that causes COVID-19 by seeing patients at home rather than in the office."  1. "What is the BEST phone number to call the day of the visit?" - include this in appointment notes  2. Do you have or have access to (through a family member/friend) a smartphone with video capability that we can use for your visit?" a. If yes - list this number in appt notes as cell (if different from BEST phone #) and list the appointment type as a VIDEO visit in appointment notes b. If no - list the appointment type as a PHONE visit in appointment notes  3. Confirm consent - "In the setting of the current Covid19 crisis, you are scheduled for a (phone or video) visit with your provider on (date) at (time).  Just as we do with many in-office visits, in order for you to participate in this visit, we must obtain consent.  If you'd like, I can send this to your mychart (if signed up) or email for you to review.  Otherwise, I can obtain your verbal consent now.  All virtual visits are billed to your insurance company just like a normal visit would be.  By agreeing to a virtual visit, we'd like you to understand that the technology does not allow for your provider to perform an examination, and thus may limit your provider's ability to fully assess your condition. If your provider identifies any concerns that need to be evaluated in person, we will make arrangements to do so.  Finally, though the technology is pretty good, we cannot assure that it will always work on either your or our end, and in the setting of a video visit, we may have to convert it to a phone-only visit.  In either situation, we cannot ensure that we have a secure connection.  Are you willing to proceed?" STAFF: Did the patient verbally acknowledge consent to telehealth visit? Document  YES/NO here: yes  4. Advise patient to be prepared - "Two hours prior to your appointment, go ahead and check your blood pressure, pulse, oxygen saturation, and your weight (if you have the equipment to check those) and write them all down. When your visit starts, your provider will ask you for this information. If you have an Apple Watch or Kardia device, please plan to have heart rate information ready on the day of your appointment. Please have a pen and paper handy nearby the day of the visit as well."  5. Give patient instructions for MyChart download to smartphone OR Doximity/Doxy.me as below if video visit (depending on what platform provider is using)  6. Inform patient they will receive a phone call 15 minutes prior to their appointment time (may be from unknown caller ID) so they should be prepared to answer    Vernon Nelson has been deemed a candidate for a follow-up tele-health visit to limit community exposure during the Covid-19 pandemic. I spoke with the patient via phone to ensure availability of phone/video source, confirm preferred email & phone number, and discuss instructions and expectations.  I reminded Vernon Nelson to be prepared with any vital sign and/or heart rhythm information that could potentially be obtained via home monitoring, at the time of his visit. I reminded Vernon Nelson to expect a phone call prior to his visit.  Vernon Nelson 12/10/2018 4:33 PM   INSTRUCTIONS FOR DOWNLOADING THE MYCHART APP TO SMARTPHONE  - The patient must first make sure to have activated MyChart and know their login information - If Apple, go to [employer] and type in MyChart in the search bar and download the app. If Android, ask patient to go to [employer] and type in [place] in the search bar and download the app. The app is free but as with any other app downloads, their phone may require them to verify saved payment information or Apple/Android  password.  - The patient will need to then log into the app with their MyChart username and password, and select [facility] as their healthcare provider to link the account. When it is time for your visit, go to the MyChart app, find appointments, and click Begin Video Visit. Be sure to Select Allow for your device to access the Microphone and Camera for your visit. You will then be connected, and your provider will be with you shortly.  **If they have any issues connecting, or need assistance please contact MyChart service desk [phone])**  **If using a computer, in order to ensure the best quality for their visit they will need to use either of the following Internet Browsers: [employer], or Google Chrome**  IF USING DOXIMITY or DOXY.ME - The patient will receive a link just prior to their visit by text.     FULL LENGTH CONSENT FOR TELE-HEALTH VISIT   I hereby voluntarily request, consent and authorize [facility] and its employed or contracted physicians, physician assistants, nurse practitioners or other licensed health care professionals (the Practitioner), to provide me with telemedicine health care services (the Services") as deemed necessary by the treating Practitioner. I acknowledge and consent to receive the Services by the Practitioner via telemedicine. I understand that the telemedicine visit will involve communicating with the Practitioner through live audiovisual communication technology and the disclosure of certain medical information by electronic transmission. I acknowledge that I have been given the opportunity to request an in-person assessment or other available alternative prior to the telemedicine visit and am voluntarily participating in the telemedicine visit.  I understand that I have the right to withhold or withdraw my consent to the use of telemedicine in the course of my care at any time, without affecting my right to future care or treatment,  and that the Practitioner or I may terminate the telemedicine visit at any time. I understand that I have the right to inspect all information obtained and/or recorded in the course of the telemedicine visit and may receive copies of available information for a reasonable fee.  I understand that some of the potential risks of receiving the Services via telemedicine include:   Delay or interruption in medical evaluation due to technological equipment failure or disruption;  Information transmitted may not be sufficient (e.g. poor resolution of images) to allow for appropriate medical decision making by the Practitioner; and/or   In rare instances, security protocols could fail, causing a breach of personal health information.  Furthermore, I acknowledge that it is my responsibility to provide information about my medical history, conditions and care that is complete and accurate to the best of my ability. I acknowledge that Practitioner's advice, recommendations, and/or decision may be based on factors not within their control, such as incomplete or inaccurate data provided by me or distortions of diagnostic images or specimens that may result from electronic transmissions. I understand that the  practice of medicine is not an Chief Strategy Officer and that Practitioner makes no warranties or guarantees regarding treatment outcomes. I acknowledge that I will receive a copy of this consent concurrently upon execution via email to the email address I last provided but may also request a printed copy by calling the office of [facility].    I understand that my insurance will be billed for this visit.   I have read or had this consent read to me.  I understand the contents of this consent, which adequately explains the benefits and risks of the Services being provided via telemedicine.   I have been provided ample opportunity to ask questions regarding this consent and the Services and have had my questions  answered to my satisfaction.  I give my informed consent for the services to be provided through the use of telemedicine in my medical care  By participating in this telemedicine visit I agree to the above.

## 2018-12-12 ENCOUNTER — Other Ambulatory Visit: Payer: Self-pay

## 2018-12-12 ENCOUNTER — Encounter: Payer: Self-pay | Admitting: Student

## 2018-12-12 ENCOUNTER — Telehealth (INDEPENDENT_AMBULATORY_CARE_PROVIDER_SITE_OTHER): Payer: Medicare Other | Admitting: Student

## 2018-12-12 VITALS — BP 138/93 | Ht 77.0 in | Wt 298.0 lb

## 2018-12-12 DIAGNOSIS — I5042 Chronic combined systolic (congestive) and diastolic (congestive) heart failure: Secondary | ICD-10-CM | POA: Diagnosis not present

## 2018-12-12 DIAGNOSIS — I4821 Permanent atrial fibrillation: Secondary | ICD-10-CM

## 2018-12-12 DIAGNOSIS — G4733 Obstructive sleep apnea (adult) (pediatric): Secondary | ICD-10-CM

## 2018-12-12 DIAGNOSIS — R0609 Other forms of dyspnea: Secondary | ICD-10-CM

## 2018-12-12 DIAGNOSIS — Z7189 Other specified counseling: Secondary | ICD-10-CM

## 2018-12-12 DIAGNOSIS — I10 Essential (primary) hypertension: Secondary | ICD-10-CM

## 2018-12-12 MED ORDER — LISINOPRIL 40 MG PO TABS: 40.0000 mg | ORAL_TABLET | Freq: Every day | ORAL | 3 refills | Status: DC

## 2018-12-12 NOTE — Patient Instructions (Signed)
Medication Instructions: Your physician recommends that you continue on your current medications as directed. Please refer to the Current Medication list given to you today.   Labwork: Cbc,bmet, bnp at [facility] out patient lab  Procedures/Testing: Your physician has requested that you have an echocardiogram at the [facility] office. Echocardiography is a painless test that uses sound waves to create images of your heart. It provides your doctor with information about the size and shape of your heart and how well your heart's chambers and valves are working. This procedure takes approximately one hour. There are no restrictions for this procedure.    Follow-Up: ICD check with Dr.Allred in the [place] office  3 months with Dr.Koneswarn at the [facility] office  Any Additional Special Instructions Will Be Listed Below (If Applicable).     If you need a refill on your cardiac medications before your next appointment, please call your pharmacy.       Thank you for choosing [facility] !

## 2018-12-12 NOTE — Progress Notes (Signed)
Virtual Visit via Telephone Note   This visit type was conducted due to national recommendations for restrictions regarding the COVID-19 Pandemic (e.g. social distancing) in an effort to limit this patient's exposure and mitigate transmission in our community.  Due to his co-morbid illnesses, this patient is at least at moderate risk for complications without adequate follow up.  This format is felt to be most appropriate for this patient at this time.  The patient did not have access to video technology/had technical difficulties with video requiring transitioning to audio format only (telephone).  All issues noted in this document were discussed and addressed.  No physical exam could be performed with this format.  Please refer to the patient's chart for his  consent to telehealth for [facility].   Date:  12/12/2018   ID:  Vernon Nelson, DOB 22-Sep-1955, MRN [id number]  Patient Location: Home Provider Location: Office  PCP:  Wyatt Haste, NP  Cardiologist:  Kate Sable, MD  Electrophysiologist:  None   Evaluation Performed:  Follow-Up Visit  Chief Complaint: Dyspnea on Exertion  History of Present Illness:    Vernon Nelson is a 63 y.o. male with past medical history of chronic atrial fibrillation, chronic combined systolic and diastolic CHF/nonischemic cardiomyopathy (normal cors by cath in 2016,  EF 35-40% by echo in 2017, s/p ICD placement), HTN, OSA, and COPD (on 3L [place] at night) who presents to the office today for overdue follow-up.  He was last examined by Dr. Bronson Ing in 11/2016 denied any recent chest pain or dyspnea on exertion at that time. He reported good compliance with his current medication regimen including Coreg 25 mg twice daily, Lasix 20 mg daily, Lisinopril 40 mg daily, Xarelto 20 mg daily, Hydralazine 25mg  TID, and Spironolactone 25 mg daily. BP was elevated at the time of his visit and Hydralazine was increased to 50 mg 3 times daily. It was listed in the  notes that his ICD was followed by Dr. Rayann Heman but I do not see any recent office visits with him. Was informed to follow-up in 4 months but he has not been evaluated since.  In talking with the patient today, he reports progressive dyspnea on exertion over the past year.  Denies any specific change in his symptoms.  No associated chest pain or palpitations. He reports that he has gained 20+ pounds within the past few years and weight was 298 lbs when checked today and previously at 274 lbs in 2018. By his report this has been gradual in onset.  Denies any specific orthopnea or PND. He does experience lower extremity edema. He initially tells me he takes Lasix every day but then says he utilizes this only when needed. Reports good compliance with his other medications including Coreg, Hydralazine, Lisinopril, Spironolactone, and Xarelto.  He denies any evidence of active bleeding with Xarelto. He is unsure of when his labs were most recently checked by his PCP. He has a Biotronik device in place per his report and this was last interrogated 2 to 3 years ago. Denies any recent ICD shocks.  The patient does not have symptoms concerning for COVID-19 infection (fever, chills, cough, or new shortness of breath).    Past Medical History:  Diagnosis Date  . Atrial fibrillation ([place])   . Cervical spondylolysis   . Cirrhosis, non-alcoholic ([place])   . NICM (nonischemic cardiomyopathy) ([place])    a. normal cors by cath in 2016, EF 35-40% by echo in 2017, s/p ICD placement  .  Osteoarthritis of both knees      Current Meds  Medication Sig  . carvedilol (COREG) 25 MG tablet Take 25 mg by mouth 2 (two) times daily.  . diazepam (VALIUM) 10 MG tablet Take 10 mg by mouth 2 (two) times daily.  . DULoxetine (CYMBALTA) 30 MG capsule Take 30 mg by mouth 2 (two) times daily.  . furosemide (LASIX) 40 MG tablet Take 40 mg by mouth daily.  Marland Kitchen gabapentin (NEURONTIN) 600 MG tablet Take 600 mg by mouth 3 (three) times  daily.  . hydrALAZINE (APRESOLINE) 25 MG tablet TAKE 1 TABLET BY MOUTH THREE TIMES DAILY  . lisinopril (ZESTRIL) 40 MG tablet Take 1 tablet (40 mg total) by mouth daily.  . mirtazapine (REMERON) 30 MG tablet Take 30 mg by mouth at bedtime.  . rivaroxaban (XARELTO) 20 MG TABS tablet Take 20 mg by mouth daily.  Marland Kitchen spironolactone (ALDACTONE) 25 MG tablet Take 25 mg by mouth daily.  . [DISCONTINUED] furosemide (LASIX) 20 MG tablet Take 20 mg by mouth at bedtime.  . [DISCONTINUED] lisinopril (PRINIVIL,ZESTRIL) 40 MG tablet Take 40 mg by mouth daily.  . [DISCONTINUED] Oxycodone HCl 10 MG TABS Take 10 mg by mouth.  . [DISCONTINUED] oxyCODONE-acetaminophen (PERCOCET) 10-325 MG tablet Take 1 tablet by mouth 4 (four) times daily as needed for pain.     Allergies:   Patient has no known allergies.   Social History   Tobacco Use  . Smoking status: Current Every Day Smoker    Years: 20.00    Types: Cigarettes  . Smokeless tobacco: Never Used  Substance Use Topics  . Alcohol use: No  . Drug use: No     Family Hx: The patient's family history includes Kidney disease in his father.  ROS:   Please see the history of present illness.     All other systems reviewed and are negative.   Prior CV studies:   The following studies were reviewed today:  Cardiac Catheterization: 2016   Echocardiogram: 2017   Labs/Other Tests and Data Reviewed:    EKG:  No ECG reviewed.  Recent Labs: No results found for requested labs within last 8760 hours.   Recent Lipid Panel No results found for: CHOL, TRIG, HDL, CHOLHDL, LDLCALC, LDLDIRECT  Wt Readings from Last 3 Encounters:  12/12/18 298 lb (135.2 kg)  12/13/16 274 lb (124.3 kg)  09/12/16 268 lb (121.6 kg)     Objective:    Vital Signs:  BP (!) 138/93   Ht 6\' 5"  (1.956 m)   Wt 298 lb (135.2 kg)   BMI 35.34 kg/m    General: Pleasant male sounding in NAD Psych: Normal affect. Neuro: Alert and oriented X 3.  Lungs:  Resp regular and  unlabored while talking on the phone.   ASSESSMENT & PLAN:    1. Chronic Combined Systolic and Diastolic CHF/Dyspnea on Exertion - he had normal cors by cath in 2016 with EF at 35-40% by echo in 2017. He had an ICD in place by review of prior notes and he believes this is a Biotronik device.  - he reports progressive dyspnea on exertion for the past year which is likely multifactorial in the setting of his cardiomyopathy, COPD, and obesity. No specific orthopnea, PND, or edema. Will obtain basic labs including CBC, BMET, and BNP. Update echocardiogram to assess LV function and wall motion.  - continue current regimen at this time with Coreg 25 mg twice daily, Lasix 40 mg daily, Lisinopril 40 mg daily,  Hydralazine 25mg  TID, and Spironolactone 25 mg daily. Will re-establish with EP in regards to his device as this has not been interrogated in several years per the patient's report.   2. Permanent Atrial Fibrillation - he denies any recent palpitations. Remains on Coreg 25mg  BID for rate-control. - he denies any evidence of active bleeding. Continue Xarelto for anticoagulation. Will obtain CBC and BMET for medication management.   3. HTN - BP at 138/93 on most recent check. Will continue current regimen with Coreg 25mg  BID, Hydralazine 25mg  TID, Lisinopril 40mg  daily, and Spironolactone 25mg  daily. I encouraged him to follow BP as Hydralazine can be further titrated if needed.   4. OSA/COPD - he reports good compliance with his CPAP. Uses 2-3L [place] as needed. Followed by Dr. Luan Pulling.   5. COVID-19 Education - The signs and symptoms of COVID-19 were discussed with the patient. The importance of social distancing was discussed today.  Time:   Today, I have spent 29 minutes with the patient with telehealth technology discussing the above problems.     Medication Adjustments/Labs and Tests Ordered: Current medicines are reviewed at length with the patient today.  Concerns regarding medicines are  outlined above.   Tests Ordered: Orders Placed This Encounter  Procedures  . CBC  . Basic Metabolic Panel (BMET)  . B Nat Peptide  . ECHOCARDIOGRAM COMPLETE    Medication Changes: Meds ordered this encounter  Medications  . lisinopril (ZESTRIL) 40 MG tablet    Sig: Take 1 tablet (40 mg total) by mouth daily.    Dispense:  90 tablet    Refill:  3    Order Specific Question:   Supervising Provider    Answer:   Dorothy Spark [[id number]]    Follow Up: With Dr. Bronson Ing in 3 months. Also needs to re-establish with EP.   Signed, Erma Heritage, PA-C  12/12/2018 8:48 PM    [facility]

## 2018-12-13 ENCOUNTER — Telehealth: Payer: Self-pay | Admitting: *Deleted

## 2018-12-13 NOTE — Telephone Encounter (Signed)
    Can you please forward this to the device clinic and see if a remote check can be performed as his device was last interrogated several years ago (Biotronik per his report yesterday)? Did he have any syncope with the episode yesterday? If this happens again, he needs to proceed to the ED for emergent evaluation.   Signed, Erma Heritage, PA-C 12/13/2018, 11:36 AM Pager: [phone]

## 2018-12-13 NOTE — Telephone Encounter (Signed)
Pt states that he was outside around 5:30 and started to feel hot and sweaty and started to feel a flutter in his chest. Then he felt a bomb go off in his chest and then he saw a white light go off. Pt states that it repeated again 30 sec after.  He went in the house and sat down for the rest of the day. Pt states that he feels fine now. Please advise.

## 2018-12-13 NOTE — Telephone Encounter (Signed)
See other phone note from 12/13/18 (recommendations from MD documented in both notes):  Per Dr. Rayann Heman, pt should proceed to [facility] ED for further assessment. Pt should not drive himself. Pt should f/u with Dr. Rayann Heman via virtual visit next week to establish ICD f/u.

## 2018-12-13 NOTE — Telephone Encounter (Signed)
Attempted to reach pt, call went straight to VM. LMOVM requesting call back to [facility], direct number given.  Per Dr. Rayann Heman, pt should proceed to [facility] ED for further assessment. Pt should not drive himself. Pt should f/u with Dr. Rayann Heman via virtual visit next week to establish ICD f/u.

## 2018-12-13 NOTE — Telephone Encounter (Signed)
Does not appear pt has established with EP/Device Clinic for ICD f/u. Appears he canceled/no-showed for all appointments with Dr. Rayann Heman setup in 2018. Awaiting recommendations from MD.

## 2018-12-13 NOTE — Telephone Encounter (Signed)
Calling with c/o device shocking him x 2 yesterday.  Noticed heart flutter just prior to that.  Feeling okay today though.

## 2018-12-13 NOTE — Telephone Encounter (Signed)
Spoke with patient. Advised him of recommendations. Pt agrees to go to [facility] ED, aware to not drive himself (reports he doesn't currently drive). Reports his chest has been "sore" due to ICD shocks yesterday, no other symptoms. Denies syncope, reports he was dizzy immediately prior to episode, then "everything went white" during the shock and he felt better after.   Discussed shock plan, advised pt to call 911 for transport to ED if any additional shocks, syncope, or worsening cardiac symptoms. [place] phone number for any further questions or concerns. Pt verbalizes understanding and thanked me for my call.

## 2018-12-13 NOTE — Telephone Encounter (Addendum)
LMOVM as pt does not show up as being in the ED. Reiterated Dr. Jackalyn Lombard recommendations. [place] phone number and office hours.  Message sent to scheduler to setup virtual visit with Dr. Rayann Heman next week.

## 2018-12-31 DIAGNOSIS — G894 Chronic pain syndrome: Secondary | ICD-10-CM | POA: Diagnosis not present

## 2018-12-31 DIAGNOSIS — M542 Cervicalgia: Secondary | ICD-10-CM | POA: Diagnosis not present

## 2018-12-31 DIAGNOSIS — M545 Low back pain: Secondary | ICD-10-CM | POA: Diagnosis not present

## 2018-12-31 DIAGNOSIS — M130 Polyarthritis, unspecified: Secondary | ICD-10-CM | POA: Diagnosis not present

## 2019-01-08 ENCOUNTER — Telehealth: Payer: Self-pay

## 2019-01-08 NOTE — Telephone Encounter (Signed)
Pt called returning [place] phone call. I told him per Raquel Sarna note she was calling because she did not see that he checked into the hospital. He states he did not go because he is scared of Covid-19. I let him know it is our shock plan that if a pt get shocked more than once they have to call 911 to get to the hospital. I told him that I will have Raquel Sarna call him back. Pt states it will be easier if he calls her back. I told him he could call after 2 pm. I really do not know what the pt wanted from our conversation.

## 2019-01-08 NOTE — Telephone Encounter (Signed)
Spoke with patient. Reiterated that the recommendation per our shock plan was to proceed to the hospital due to pt's reports of multiple ICD shocks on 12/12/18. Pt reports he has felt good since that day and didn't go to the hospital. He called back today to see what our recommendations are now.  Explained to pt that since he has not established with our clinic, we have no way of monitoring his ICD at this point. He previously followed in [place] but does not have a home monitor for his ICD. Pt is unable to get transportation to [place]. He does not drive and relies on his girlfriend for transportation, but she is working next week. Offered virtual visit with Dr. Rayann Heman on 01/15/19 as per previous recommendation, but pt reports he does not have video-capable technology and that his phone is not very reliable.   Advised pt I will discuss with Dr. Rayann Heman and his nurse for recommendations. Pt requests an appointment in [place] if possible, but schedule for 01/17/19 is full. Advised I will call him back on Monday with recommendations. Pt verbalizes agreement with plan. He is aware to call us for any questions or concerns in the interim.

## 2019-01-09 ENCOUNTER — Ambulatory Visit (INDEPENDENT_AMBULATORY_CARE_PROVIDER_SITE_OTHER): Payer: Medicare Other

## 2019-01-09 ENCOUNTER — Other Ambulatory Visit: Payer: Self-pay

## 2019-01-09 DIAGNOSIS — I5042 Chronic combined systolic (congestive) and diastolic (congestive) heart failure: Secondary | ICD-10-CM

## 2019-01-09 DIAGNOSIS — R0609 Other forms of dyspnea: Secondary | ICD-10-CM

## 2019-01-10 ENCOUNTER — Telehealth: Payer: Self-pay | Admitting: *Deleted

## 2019-01-10 NOTE — Telephone Encounter (Signed)
Called patient with test results. No answer. Left message to call back.  

## 2019-01-10 NOTE — Telephone Encounter (Signed)
-----   Message from Erma Heritage, [place] sent at 01/10/2019 12:11 PM EDT ----- Please let the patient know the pumping function of his heart had improved as compared to prior imaging. EF was previously 35-40% by imaging in 2017, at 40-45% by repeat echocardiogram. Did have mild calcification of the mitral valve with no significant stenosis or regurgitation. Remains on Coreg, Lasix, Lisinopril, Hydralazine and Spironolactone. Please encourage him to have follow-up labs obtained as previously ordered. By review of notes, EP is working on arranging follow-up given his recent ICD firing.

## 2019-01-13 NOTE — Telephone Encounter (Signed)
Lisbeth Renshaw, CMA, to advise pt to proceed to [facility] ED for further assessment given another recent shock. Pt is unable to come to [facility] for a device check and does not drive per previous notes. Will update pt as soon as I have any additional recommendations from Dr. Rayann Heman.

## 2019-01-13 NOTE — Telephone Encounter (Signed)
Pt called to confirmed an appointment with Dr. Rayann Heman but Raquel Sarna was not able to speak to Dr. Rayann Heman about an earlier appointment for pt at this moment. Pt states he got another shock a few days ago. Per Raquel Sarna, RN I advised the pt to go to the ER at [facility]. I let the pt know according to the [facility] he should not drive himself. The pt agreed to go to the ER. Raquel Sarna states she will call the pt back.

## 2019-01-13 NOTE — Telephone Encounter (Signed)
LMOVM to inform pt of Dr. Bonita Quin recommendations; go to the ER and pt can have VV or in clinic visit in [facility] w/ Dr. Rayann Heman to establish.

## 2019-01-15 NOTE — Telephone Encounter (Signed)
LMOVM to inform pt of Dr. Bonita Quin recommendations.

## 2019-01-15 NOTE — Telephone Encounter (Addendum)
Spoke to pt, told him Dr. Bonita Quin recommendations, informed him he needed to go to the ER d/t receiving multiple shocks. Pt agreeable to going to ER and to virtual visit or in clinic visit in [place].

## 2019-01-28 DIAGNOSIS — M545 Low back pain: Secondary | ICD-10-CM | POA: Diagnosis not present

## 2019-01-28 DIAGNOSIS — Z79891 Long term (current) use of opiate analgesic: Secondary | ICD-10-CM | POA: Diagnosis not present

## 2019-01-28 DIAGNOSIS — M130 Polyarthritis, unspecified: Secondary | ICD-10-CM | POA: Diagnosis not present

## 2019-01-28 DIAGNOSIS — M542 Cervicalgia: Secondary | ICD-10-CM | POA: Diagnosis not present

## 2019-01-28 DIAGNOSIS — G894 Chronic pain syndrome: Secondary | ICD-10-CM | POA: Diagnosis not present

## 2019-02-12 ENCOUNTER — Telehealth: Payer: Self-pay

## 2019-02-12 DIAGNOSIS — G4733 Obstructive sleep apnea (adult) (pediatric): Secondary | ICD-10-CM | POA: Diagnosis not present

## 2019-02-12 DIAGNOSIS — I509 Heart failure, unspecified: Secondary | ICD-10-CM | POA: Diagnosis not present

## 2019-02-12 DIAGNOSIS — J441 Chronic obstructive pulmonary disease with (acute) exacerbation: Secondary | ICD-10-CM | POA: Diagnosis not present

## 2019-02-12 DIAGNOSIS — J9611 Chronic respiratory failure with hypoxia: Secondary | ICD-10-CM | POA: Diagnosis not present

## 2019-02-12 NOTE — Telephone Encounter (Signed)
Left a message regarding appt on 02/13/19.

## 2019-02-13 ENCOUNTER — Telehealth (INDEPENDENT_AMBULATORY_CARE_PROVIDER_SITE_OTHER): Payer: Medicare Other | Admitting: Internal Medicine

## 2019-02-13 ENCOUNTER — Encounter: Payer: Self-pay | Admitting: Internal Medicine

## 2019-02-13 VITALS — BP 138/107 | HR 68 | Ht 77.0 in | Wt 278.0 lb

## 2019-02-13 DIAGNOSIS — I5042 Chronic combined systolic (congestive) and diastolic (congestive) heart failure: Secondary | ICD-10-CM

## 2019-02-13 DIAGNOSIS — G4733 Obstructive sleep apnea (adult) (pediatric): Secondary | ICD-10-CM | POA: Diagnosis not present

## 2019-02-13 DIAGNOSIS — I428 Other cardiomyopathies: Secondary | ICD-10-CM

## 2019-02-13 DIAGNOSIS — I480 Paroxysmal atrial fibrillation: Secondary | ICD-10-CM

## 2019-02-13 NOTE — Progress Notes (Signed)
Electrophysiology TeleHealth Note  Due to national recommendations of social distancing due to [place] 19, an audio telehealth visit is felt to be most appropriate for this patient at this time.  Verbal consent was obtained by me for the telehealth visit today.  The patient does not have capability for a virtual visit.  A phone visit is therefore required today.   Date:  02/13/2019   ID:  Vernon Nelson, DOB April 08, 1956, MRN [id number]  Location: patient's home  Provider location:  [facility]  Evaluation Performed: Follow-up visit  PCP:  Wyatt Haste, NP   Electrophysiologist:  Dr Rayann Heman  Chief Complaint:  ICD evaluation  History of Present Illness:    Vernon Nelson is a 63 y.o. male who presents via telehealth conferencing today. He is referred by Dr Bronson Ing to establish in the device clinic.  He has moved from [place] to [place].  He previously had a single chamber Biotronik ICD implanted in [place]. He reports having his first ICD shock while bringing in groceries.  He has not gone to the ED as previously advisd to have his device evaluated.  He does not drive.   Today, he denies symptoms of palpitations, chest pain, shortness of breath,  lower extremity edema, dizziness, presyncope, or syncope.  The patient is otherwise without complaint today.  The patient denies symptoms of fevers, chills, cough, or new SOB worrisome for COVID 19.  Past Medical History:  Diagnosis Date  . Atrial fibrillation ([place])   . Cervical spondylolysis   . Cirrhosis, non-alcoholic ([place])   . COPD (chronic obstructive pulmonary disease) ([place])    on home O2  . HTN (hypertension)   . Morbid obesity ([place])   . NICM (nonischemic cardiomyopathy) ([place])    a. normal cors by cath in 2016, EF 35-40% by echo in 2017, s/p ICD placement  . OSA (obstructive sleep apnea)    uses CPAP  . Osteoarthritis of both knees    Current Outpatient Medications  Medication Sig Dispense Refill  . carvedilol (COREG) 25 MG  tablet Take 25 mg by mouth 2 (two) times daily.    . diazepam (VALIUM) 10 MG tablet Take 10 mg by mouth 2 (two) times daily.    . DULoxetine (CYMBALTA) 30 MG capsule Take 30 mg by mouth daily.     . furosemide (LASIX) 40 MG tablet Take 40 mg by mouth daily.    Marland Kitchen gabapentin (NEURONTIN) 600 MG tablet Take 600 mg by mouth 4 (four) times daily.     . hydrALAZINE (APRESOLINE) 25 MG tablet TAKE 1 TABLET BY MOUTH THREE TIMES DAILY (Patient taking differently: Take 40 mg by mouth 3 (three) times daily. ) 30 tablet 0  . lisinopril (ZESTRIL) 40 MG tablet Take 1 tablet (40 mg total) by mouth daily. 90 tablet 3  . mirtazapine (REMERON) 30 MG tablet Take 30 mg by mouth at bedtime.    . rivaroxaban (XARELTO) 20 MG TABS tablet Take 20 mg by mouth daily.    Marland Kitchen spironolactone (ALDACTONE) 25 MG tablet Take 25 mg by mouth daily.     No current facility-administered medications for this visit.     Allergies:   Patient has no known allergies.   Social History:  The patient  reports that he has been smoking cigarettes. He has smoked for the past 20.00 years. He has never used smokeless tobacco. He reports that he does not drink alcohol or use drugs.   Family History:  The patient's  family history includes Kidney disease in his father.   ROS:  Please see the history of present illness.   All other systems are personally reviewed and negative.    Exam:    Vital Signs:  BP (!) 138/107   Pulse 68   Ht 6\' 5"  (1.956 m)   Wt 278 lb (126.1 kg)   BMI 32.97 kg/m   Well sounding and appearing, alert and conversant, regular work of breathing,  good skin color Eyes- anicteric, neuro- grossly intact, skin- no apparent rash or lesions or cyanosis, mouth- oral mucosa is pink  Labs/Other Tests and Data Reviewed:    Recent Labs: No results found for requested labs within last 8760 hours.   Wt Readings from Last 3 Encounters:  02/13/19 278 lb (126.1 kg)  12/12/18 298 lb (135.2 kg)  12/13/16 274 lb (124.3 kg)       ASSESSMENT & PLAN:    1.  ? ICD shock Patient feels that he has had several recent ICD shocks.  He has not had his device interrogated recently.  He does not remotely monitor his device. We will schedule for in office device interrogation (Biotronik) 02/21/2019. He does not drive  2. Nonischemic CM/ chronic systolic dysfunction euvolemic Follows with Dr Bronson Ing  3. afib On xarelto Will look at his DVx device to see if we can get a sense for his AF burden on device check next week  4. Obesity Lifestyle modification encouraged  5. OSA Compliant with CPAP  6. Tobacco Cessation advised   Follow-up:  Next week for device check in [place] office Follow-up with Dr Bronson Ing as scheduled   Patient Risk:  after full review of this patients clinical status, I feel that they are at high risk at this time.  Today, I have spent 15 minutes with the patient with telehealth technology discussing arrhythmia management .    Army Fossa, MD  02/13/2019 3:28 PM     [place] [address] [place] [address] [phone] (office) [phone] (fax)

## 2019-02-13 NOTE — Telephone Encounter (Signed)
The pt returned April call. I reminded the pt that he has a virtual visit with Dr. Rayann Heman and that it is at 3:15 pm. Pt states he do not have facetime with his phone. He said it will be best if Dr. Rayann Heman call him at [phone]. I told him if Dr. Rayann Heman do not call him at 3:15 pm to call Dr. Rayann Heman at the [facility] office for his virtual visit. The pt verbalized understanding.

## 2019-02-14 ENCOUNTER — Telehealth: Payer: Self-pay | Admitting: *Deleted

## 2019-02-14 NOTE — Telephone Encounter (Signed)
-----   Message from Thompson Grayer, MD sent at 02/13/2019  3:28 PM EDT ----- Please schedule for ICD (Biotronik) device interrogation in [place] office 02/21/19.

## 2019-02-14 NOTE — Telephone Encounter (Signed)
LMOVM requesting call back to DC to schedule appointment with Dr. Rayann Heman in [place] on 02/21/19. Per [employer], okay to double-book in AM, need to make [employer] rep) aware of appointment time.

## 2019-02-17 NOTE — Telephone Encounter (Signed)
LMOVM for pt to return call 

## 2019-02-18 NOTE — Telephone Encounter (Signed)
Pt stated that he was told he could schedule an appt on 9/4 in the afternoon. He agreed to 9/4 at 2:30 PM.

## 2019-02-18 NOTE — Telephone Encounter (Signed)
Attempted to call pt. Went straight to [employer]. LMOVM for pt to return call to my direct number.

## 2019-02-21 ENCOUNTER — Encounter: Payer: Medicare Other | Admitting: Internal Medicine

## 2019-02-21 ENCOUNTER — Telehealth: Payer: Self-pay

## 2019-02-21 NOTE — Telephone Encounter (Signed)
Pt called stating he needs to reschedule his appointment with Dr. Rayann Heman in [place]

## 2019-02-25 DIAGNOSIS — M130 Polyarthritis, unspecified: Secondary | ICD-10-CM | POA: Diagnosis not present

## 2019-02-25 DIAGNOSIS — G894 Chronic pain syndrome: Secondary | ICD-10-CM | POA: Diagnosis not present

## 2019-02-25 DIAGNOSIS — M542 Cervicalgia: Secondary | ICD-10-CM | POA: Diagnosis not present

## 2019-02-25 DIAGNOSIS — M545 Low back pain: Secondary | ICD-10-CM | POA: Diagnosis not present

## 2019-02-25 DIAGNOSIS — Z79891 Long term (current) use of opiate analgesic: Secondary | ICD-10-CM | POA: Diagnosis not present

## 2019-02-28 DIAGNOSIS — Z23 Encounter for immunization: Secondary | ICD-10-CM | POA: Diagnosis not present

## 2019-03-14 ENCOUNTER — Telehealth: Payer: Self-pay | Admitting: Cardiovascular Disease

## 2019-03-14 NOTE — Telephone Encounter (Signed)
Virtual Visit Pre-Appointment Phone Call  "(Name), I am calling you today to discuss your upcoming appointment. We are currently trying to limit exposure to the virus that causes COVID-19 by seeing patients at home rather than in the office."  1. "What is the BEST phone number to call the day of the visit?" - include this in appointment notes  2. Do you have or have access to (through a family member/friend) a smartphone with video capability that we can use for your visit?" a. If yes - list this number in appt notes as cell (if different from BEST phone #) and list the appointment type as a VIDEO visit in appointment notes b. If no - list the appointment type as a PHONE visit in appointment notes  3. Confirm consent - "In the setting of the current Covid19 crisis, you are scheduled for a (phone or video) visit with your provider on (date) at (time).  Just as we do with many in-office visits, in order for you to participate in this visit, we must obtain consent.  If you'd like, I can send this to your mychart (if signed up) or email for you to review.  Otherwise, I can obtain your verbal consent now.  All virtual visits are billed to your insurance company just like a normal visit would be.  By agreeing to a virtual visit, we'd like you to understand that the technology does not allow for your provider to perform an examination, and thus may limit your provider's ability to fully assess your condition. If your provider identifies any concerns that need to be evaluated in person, we will make arrangements to do so.  Finally, though the technology is pretty good, we cannot assure that it will always work on either your or our end, and in the setting of a video visit, we may have to convert it to a phone-only visit.  In either situation, we cannot ensure that we have a secure connection.  Are you willing to proceed?" STAFF: Did the patient verbally acknowledge consent to telehealth visit? Document  YES/NO here:  yes 4. Advise patient to be prepared - "Two hours prior to your appointment, go ahead and check your blood pressure, pulse, oxygen saturation, and your weight (if you have the equipment to check those) and write them all down. When your visit starts, your provider will ask you for this information. If you have an Apple Watch or Kardia device, please plan to have heart rate information ready on the day of your appointment. Please have a pen and paper handy nearby the day of the visit as well."  5. Give patient instructions for MyChart download to smartphone OR Doximity/[email or web address] as below if video visit (depending on what platform provider is using)  6. Inform patient they will receive a phone call 15 minutes prior to their appointment time (may be from unknown caller ID) so they should be prepared to answer    [facility] has been deemed a candidate for a follow-up tele-health visit to limit community exposure during the Covid-19 pandemic. I spoke with the patient via phone to ensure availability of phone/video source, confirm preferred email & phone number, and discuss instructions and expectations.  I reminded Vernon Nelson to be prepared with any vital sign and/or heart rhythm information that could potentially be obtained via home monitoring, at the time of his visit. I reminded Vernon Nelson to expect a phone call prior to his visit.  Vernon Nelson 03/14/2019 4:17 PM   INSTRUCTIONS FOR DOWNLOADING THE MYCHART APP TO SMARTPHONE  - The patient must first make sure to have activated MyChart and know their login information - If Apple, go to [employer] and type in MyChart in the search bar and download the app. If Android, ask patient to go to [employer] and type in [place] in the search bar and download the app. The app is free but as with any other app downloads, their phone may require them to verify saved payment information or Apple/Android  password.  - The patient will need to then log into the app with their MyChart username and password, and select [facility] as their healthcare provider to link the account. When it is time for your visit, go to the MyChart app, find appointments, and click Begin Video Visit. Be sure to Select Allow for your device to access the Microphone and Camera for your visit. You will then be connected, and your provider will be with you shortly.  **If they have any issues connecting, or need assistance please contact MyChart service desk [phone])**  **If using a computer, in order to ensure the best quality for their visit they will need to use either of the following Internet Browsers: [employer], or Google Chrome**  IF USING DOXIMITY or DOXY.ME - The patient will receive a link just prior to their visit by text.     FULL LENGTH CONSENT FOR TELE-HEALTH VISIT   I hereby voluntarily request, consent and authorize [facility] and its employed or contracted physicians, physician assistants, nurse practitioners or other licensed health care professionals (the Practitioner), to provide me with telemedicine health care services (the Services") as deemed necessary by the treating Practitioner. I acknowledge and consent to receive the Services by the Practitioner via telemedicine. I understand that the telemedicine visit will involve communicating with the Practitioner through live audiovisual communication technology and the disclosure of certain medical information by electronic transmission. I acknowledge that I have been given the opportunity to request an in-person assessment or other available alternative prior to the telemedicine visit and am voluntarily participating in the telemedicine visit.  I understand that I have the right to withhold or withdraw my consent to the use of telemedicine in the course of my care at any time, without affecting my right to future care or treatment,  and that the Practitioner or I may terminate the telemedicine visit at any time. I understand that I have the right to inspect all information obtained and/or recorded in the course of the telemedicine visit and may receive copies of available information for a reasonable fee.  I understand that some of the potential risks of receiving the Services via telemedicine include:   Delay or interruption in medical evaluation due to technological equipment failure or disruption;  Information transmitted may not be sufficient (e.g. poor resolution of images) to allow for appropriate medical decision making by the Practitioner; and/or   In rare instances, security protocols could fail, causing a breach of personal health information.  Furthermore, I acknowledge that it is my responsibility to provide information about my medical history, conditions and care that is complete and accurate to the best of my ability. I acknowledge that Practitioner's advice, recommendations, and/or decision may be based on factors not within their control, such as incomplete or inaccurate data provided by me or distortions of diagnostic images or specimens that may result from electronic transmissions. I understand that the  practice of medicine is not an Chief Strategy Officer and that Practitioner makes no warranties or guarantees regarding treatment outcomes. I acknowledge that I will receive a copy of this consent concurrently upon execution via email to the email address I last provided but may also request a printed copy by calling the office of [facility].    I understand that my insurance will be billed for this visit.   I have read or had this consent read to me.  I understand the contents of this consent, which adequately explains the benefits and risks of the Services being provided via telemedicine.   I have been provided ample opportunity to ask questions regarding this consent and the Services and have had my questions  answered to my satisfaction.  I give my informed consent for the services to be provided through the use of telemedicine in my medical care  By participating in this telemedicine visit I agree to the above.

## 2019-03-18 ENCOUNTER — Other Ambulatory Visit: Payer: Self-pay | Admitting: Cardiovascular Disease

## 2019-03-18 ENCOUNTER — Telehealth: Payer: Medicare Other | Admitting: Cardiovascular Disease

## 2019-03-18 MED ORDER — LISINOPRIL 40 MG PO TABS: 40.0000 mg | ORAL_TABLET | Freq: Every day | ORAL | 0 refills | Status: DC

## 2019-03-18 NOTE — Telephone Encounter (Signed)
°*  STAT* If patient is at the pharmacy, call can be transferred to refill team.   1. Which medications need to be refilled? lisinopril (ZESTRIL) 40 MG tablet   2. Which pharmacy/location (including street and city if local pharmacy) is medication to be sent to? [facility] in [place]  3. Do they need a 30 day or 90 day supply?

## 2019-03-19 ENCOUNTER — Encounter: Payer: Self-pay | Admitting: Cardiovascular Disease

## 2019-03-21 ENCOUNTER — Encounter: Payer: Medicare Other | Admitting: Internal Medicine

## 2019-03-25 ENCOUNTER — Ambulatory Visit: Payer: Medicare Other | Admitting: *Deleted

## 2019-03-25 ENCOUNTER — Other Ambulatory Visit (HOSPITAL_BASED_OUTPATIENT_CLINIC_OR_DEPARTMENT_OTHER): Payer: Self-pay

## 2019-03-25 DIAGNOSIS — G4710 Hypersomnia, unspecified: Secondary | ICD-10-CM

## 2019-03-25 DIAGNOSIS — R0683 Snoring: Secondary | ICD-10-CM

## 2019-03-26 ENCOUNTER — Other Ambulatory Visit: Payer: Self-pay

## 2019-03-26 ENCOUNTER — Observation Stay (HOSPITAL_COMMUNITY)
Admission: EM | Admit: 2019-03-26 | Discharge: 2019-03-27 | Disposition: A | Discharge status: Home / Self Care | Payer: Medicare Other | Attending: Internal Medicine | Admitting: Internal Medicine

## 2019-03-26 ENCOUNTER — Encounter (HOSPITAL_COMMUNITY): Payer: Self-pay | Admitting: Emergency Medicine

## 2019-03-26 ENCOUNTER — Emergency Department (HOSPITAL_COMMUNITY): Payer: Medicare Other

## 2019-03-26 DIAGNOSIS — K7460 Unspecified cirrhosis of liver: Secondary | ICD-10-CM | POA: Diagnosis present

## 2019-03-26 DIAGNOSIS — Z79899 Other long term (current) drug therapy: Secondary | ICD-10-CM | POA: Diagnosis not present

## 2019-03-26 DIAGNOSIS — I4510 Unspecified right bundle-branch block: Secondary | ICD-10-CM | POA: Diagnosis not present

## 2019-03-26 DIAGNOSIS — Z20828 Contact with and (suspected) exposure to other viral communicable diseases: Secondary | ICD-10-CM | POA: Diagnosis not present

## 2019-03-26 DIAGNOSIS — I10 Essential (primary) hypertension: Secondary | ICD-10-CM | POA: Diagnosis present

## 2019-03-26 DIAGNOSIS — I428 Other cardiomyopathies: Secondary | ICD-10-CM | POA: Diagnosis not present

## 2019-03-26 DIAGNOSIS — J449 Chronic obstructive pulmonary disease, unspecified: Secondary | ICD-10-CM | POA: Diagnosis not present

## 2019-03-26 DIAGNOSIS — M170 Bilateral primary osteoarthritis of knee: Secondary | ICD-10-CM | POA: Diagnosis not present

## 2019-03-26 DIAGNOSIS — I4891 Unspecified atrial fibrillation: Secondary | ICD-10-CM | POA: Diagnosis present

## 2019-03-26 DIAGNOSIS — I482 Chronic atrial fibrillation, unspecified: Secondary | ICD-10-CM | POA: Diagnosis not present

## 2019-03-26 DIAGNOSIS — Z7901 Long term (current) use of anticoagulants: Secondary | ICD-10-CM | POA: Diagnosis not present

## 2019-03-26 DIAGNOSIS — Y831 Surgical operation with implant of artificial internal device as the cause of abnormal reaction of the patient, or of later complication, without mention of misadventure at the time of the procedure: Secondary | ICD-10-CM | POA: Diagnosis not present

## 2019-03-26 DIAGNOSIS — I5042 Chronic combined systolic (congestive) and diastolic (congestive) heart failure: Secondary | ICD-10-CM | POA: Diagnosis present

## 2019-03-26 DIAGNOSIS — G4733 Obstructive sleep apnea (adult) (pediatric): Secondary | ICD-10-CM | POA: Diagnosis not present

## 2019-03-26 DIAGNOSIS — F17210 Nicotine dependence, cigarettes, uncomplicated: Secondary | ICD-10-CM | POA: Diagnosis not present

## 2019-03-26 DIAGNOSIS — T82198A Other mechanical complication of other cardiac electronic device, initial encounter: Secondary | ICD-10-CM | POA: Diagnosis not present

## 2019-03-26 DIAGNOSIS — M7989 Other specified soft tissue disorders: Secondary | ICD-10-CM | POA: Diagnosis not present

## 2019-03-26 DIAGNOSIS — Z950 Presence of cardiac pacemaker: Secondary | ICD-10-CM | POA: Diagnosis not present

## 2019-03-26 DIAGNOSIS — R079 Chest pain, unspecified: Secondary | ICD-10-CM | POA: Diagnosis not present

## 2019-03-26 DIAGNOSIS — I110 Hypertensive heart disease with heart failure: Secondary | ICD-10-CM | POA: Diagnosis not present

## 2019-03-26 DIAGNOSIS — Z720 Tobacco use: Secondary | ICD-10-CM | POA: Diagnosis present

## 2019-03-26 LAB — BRAIN NATRIURETIC PEPTIDE: B Natriuretic Peptide: 371 pg/mL — ABNORMAL HIGH (ref 0.0–100.0)

## 2019-03-26 LAB — CBC
HCT: 41.9 % (ref 39.0–52.0)
Hemoglobin: 13.3 g/dL (ref 13.0–17.0)
MCH: 30.8 pg (ref 26.0–34.0)
MCHC: 31.7 g/dL (ref 30.0–36.0)
MCV: 97 fL (ref 80.0–100.0)
Platelets: 304 10*3/uL (ref 150–400)
RBC: 4.32 MIL/uL (ref 4.22–5.81)
RDW: 15.3 % (ref 11.5–15.5)
WBC: 10.3 10*3/uL (ref 4.0–10.5)
nRBC: 0 % (ref 0.0–0.2)

## 2019-03-26 LAB — TROPONIN I (HIGH SENSITIVITY)
Troponin I (High Sensitivity): 57 ng/L — ABNORMAL HIGH (ref <18)
Troponin I (High Sensitivity): 76 ng/L — ABNORMAL HIGH (ref <18)

## 2019-03-26 LAB — BASIC METABOLIC PANEL
Anion gap: <3 — ABNORMAL LOW (ref 5–15)
BUN: 12 mg/dL (ref 8–23)
CO2: 35 mmol/L — ABNORMAL HIGH (ref 22–32)
Calcium: 8.9 mg/dL (ref 8.9–10.3)
Chloride: 98 mmol/L (ref 98–111)
Creatinine, Ser: 1.2 mg/dL (ref 0.61–1.24)
GFR calc Af Amer: >60 mL/min (ref >60)
GFR calc non Af Amer: >60 mL/min (ref >60)
Glucose, Bld: 178 mg/dL — ABNORMAL HIGH (ref 70–99)
Potassium: 4.4 mmol/L (ref 3.5–5.1)
Sodium: 135 mmol/L (ref 135–145)

## 2019-03-26 LAB — SARS CORONAVIRUS 2 BY RT PCR (HOSPITAL ORDER, PERFORMED IN ~~LOC~~ HOSPITAL LAB): SARS Coronavirus 2: NEGATIVE

## 2019-03-26 LAB — MAGNESIUM: Magnesium: 2 mg/dL (ref 1.7–2.4)

## 2019-03-26 MED ORDER — MAGNESIUM SULFATE 2 GM/50ML IV SOLN
2.0000 g | Freq: Once | INTRAVENOUS | Status: AC
  Administered 2019-03-26: 2 g via INTRAVENOUS
  Filled 2019-03-26: qty 50

## 2019-03-26 MED ORDER — FUROSEMIDE 10 MG/ML IJ SOLN
40.0000 mg | Freq: Once | INTRAMUSCULAR | Status: AC
  Administered 2019-03-26: 40 mg via INTRAVENOUS
  Filled 2019-03-26: qty 4

## 2019-03-26 MED ORDER — METOPROLOL TARTRATE 50 MG PO TABS
50.0000 mg | ORAL_TABLET | Freq: Four times a day (QID) | ORAL | Status: DC
  Filled 2019-03-26: qty 1

## 2019-03-26 MED ORDER — AMIODARONE HCL IN DEXTROSE 360-4.14 MG/200ML-% IV SOLN
60.0000 mg/h | INTRAVENOUS | Status: DC
  Administered 2019-03-27 (×2): 60 mg/h via INTRAVENOUS
  Filled 2019-03-26 (×2): qty 200

## 2019-03-26 MED ORDER — AMIODARONE HCL IN DEXTROSE 360-4.14 MG/200ML-% IV SOLN: 30.0000 mg/h | INTRAVENOUS | Status: DC

## 2019-03-26 MED ORDER — SODIUM CHLORIDE 0.9% FLUSH
3.0000 mL | Freq: Once | INTRAVENOUS | Status: AC
  Administered 2019-03-26: 3 mL via INTRAVENOUS

## 2019-03-26 MED ORDER — METOPROLOL TARTRATE 50 MG PO TABS: 50.0000 mg | ORAL_TABLET | Freq: Two times a day (BID) | ORAL | Status: DC

## 2019-03-26 MED ORDER — AMIODARONE LOAD VIA INFUSION
150.0000 mg | Freq: Once | INTRAVENOUS | Status: AC
  Administered 2019-03-27: 150 mg via INTRAVENOUS
  Filled 2019-03-26: qty 83.34

## 2019-03-26 MED ORDER — AMIODARONE LOAD VIA INFUSION: 150.0000 mg | Freq: Once | INTRAVENOUS | Status: DC

## 2019-03-26 NOTE — ED Triage Notes (Signed)
Pt states he defibrillator has shocked him x6 today.

## 2019-03-26 NOTE — ED Notes (Signed)
PT HAS A BIOTRONIK ICD PLACED 01/25/2015.

## 2019-03-26 NOTE — ED Notes (Signed)
Pt O2 sats noted to be in the lower to mid 80s on RA. When asked pt stated he is intermittently on 3-4L [place] at home as well as CPAP at night. Pt added that CPAP hasnt been working properly. Pt girlfriend is bringing his Biotronik home monitor.

## 2019-03-26 NOTE — Plan of Care (Signed)
Vernon Nelson is a 63 year old with multiple medical problems including AF (on Xarelto) and NICM (EF 40-45% from 35-40% in 2017) s/p Biotronik single chamber ICD. He was shocked by his ICD in July and advised to go to the ED but never did. He has established care with Dr. Rayann Heman, but telehealth only and does not drive. He was a no show to his device clinic appointment.   Unfortunately, he now finally presents to [facility] ED after being shocked a total of 8 times. Biotronik reports that these were all for atrial fibrillation with RVR (though no atrial lead), we are awaiting the complete report. He is in AF currently around 100 bpm. From what I am told it doesn't seem that the shocks are due to ventricular lead noise. Start amiodarone IV load and continue his Xarelto. He will need EP evaluation in the morning to at least consider changes in device settings to avoid inappropriate shocks for AF.

## 2019-03-26 NOTE — ED Provider Notes (Signed)
[facility] EMERGENCY DEPARTMENT Provider Note   CSN: [id number] Arrival date & time: 03/26/19  1929     History   Chief Complaint Chief Complaint  Patient presents with  . Chest Pain    HPI Vernon Nelson is a 63 y.o. male.     HPI Patient states that he was shopping this evening and started having palpitations and lightheadedness.  His pacemaker discharge.  States is going off 6 times in total since the initial firing.  States his chest now feels like he has been "kicked by [place]" on the left side.  Denies recent fever or chills.  Has chronic lower extremity swelling which is unchanged. Past Medical History:  Diagnosis Date  . Atrial fibrillation ([place])   . Cervical spondylolysis   . Cirrhosis, non-alcoholic ([place])   . COPD (chronic obstructive pulmonary disease) ([place])    on home O2  . HTN (hypertension)   . Morbid obesity ([place])   . NICM (nonischemic cardiomyopathy) ([place])    a. normal cors by cath in 2016, EF 35-40% by echo in 2017, s/p ICD placement  . OSA (obstructive sleep apnea)    uses CPAP  . Osteoarthritis of both knees     Patient Active Problem List   Diagnosis Date Noted  . Atrial fibrillation with RVR ([place]) 03/26/2019  . HTN (hypertension)   . OSA (obstructive sleep apnea)   . Morbid obesity ([place])   . COPD (chronic obstructive pulmonary disease) ([place])   . Cirrhosis, non-alcoholic ([place])   . Tobacco use   . Chronic combined systolic and diastolic heart failure ([place]) 12/12/2018  . Chronic atrial fibrillation ([place]) 09/12/2016    Past Surgical History:  Procedure Laterality Date  . ICD IMPLANT  2015/2016   INDIAN PATH BRISTOL TN DR MCQUEEN ([facility])        Home Medications    Prior to Admission medications   Medication Sig Start Date End Date Taking? Authorizing Provider  carvedilol (COREG) 25 MG tablet Take 25 mg by mouth 2 (two) times daily.    [provider]  diazepam (VALIUM) 10 MG tablet Take 10 mg by mouth 2 (two) times daily.     [provider]  DULoxetine (CYMBALTA) 30 MG capsule Take 30 mg by mouth daily.     [provider]  furosemide (LASIX) 40 MG tablet Take 40 mg by mouth daily.    [provider]  gabapentin (NEURONTIN) 600 MG tablet Take 600 mg by mouth 4 (four) times daily.     [provider]  hydrALAZINE (APRESOLINE) 25 MG tablet TAKE 1 TABLET BY MOUTH THREE TIMES DAILY Patient taking differently: Take 40 mg by mouth 3 (three) times daily.  05/14/18   Herminio Commons, MD  lisinopril (ZESTRIL) 40 MG tablet TAKE 1 TABLET BY MOUTH EVERY DAY 03/18/19   Herminio Commons, MD  mirtazapine (REMERON) 30 MG tablet Take 30 mg by mouth at bedtime.    [provider]  rivaroxaban (XARELTO) 20 MG TABS tablet Take 20 mg by mouth daily.    [provider]  spironolactone (ALDACTONE) 25 MG tablet Take 25 mg by mouth daily.    [provider]    Family History Family History  Problem Relation Age of Onset  . Kidney disease Father     Social History Social History   Tobacco Use  . Smoking status: Current Every Day Smoker    Years: 20.00    Types: Cigarettes  . Smokeless tobacco: Never Used  Substance Use Topics  . Alcohol use: No  . Drug use: No     Allergies   Patient has no known allergies.   Review of Systems Review of Systems  Constitutional: Negative for chills and fever.  HENT: Negative for sore throat and trouble swallowing.   Eyes: Negative for visual disturbance.  Respiratory: Negative for cough and shortness of breath.   Cardiovascular: Positive for chest pain, palpitations and leg swelling.  Gastrointestinal: Negative for abdominal pain, constipation, diarrhea, nausea and vomiting.  Musculoskeletal: Negative for back pain, myalgias and neck pain.  Skin: Negative for rash and wound.  Neurological: Positive for light-headedness. Negative for dizziness, weakness, numbness and headaches.  All other systems reviewed and are  negative.    Physical Exam Updated Vital Signs BP (!) 148/97   Pulse 88   Temp 98.3 F (36.8 C) (Oral)   Resp 18   SpO2 100%   Physical Exam Vitals signs and nursing note reviewed.  Constitutional:      Appearance: Normal appearance. He is well-developed.  HENT:     Head: Normocephalic and atraumatic.     Nose: Nose normal.     Mouth/Throat:     Mouth: Mucous membranes are moist.  Eyes:     Pupils: Pupils are equal, round, and reactive to light.  Neck:     Musculoskeletal: Normal range of motion and neck supple. No neck rigidity or muscular tenderness.  Cardiovascular:     Rate and Rhythm: Rhythm irregular.     Heart sounds: No murmur. No friction rub. No gallop.   Pulmonary:     Effort: Pulmonary effort is normal. No respiratory distress.     Breath sounds: No stridor. Rhonchi present. No wheezing or rales.     Comments: Few scattered rhonchi's especially in the bilateral bases.  No respiratory distress.  Left upper chest pacer defibrillator in place.  No erythema or warmth. Chest:     Chest wall: No tenderness.  Abdominal:     General: Bowel sounds are normal.     Palpations: Abdomen is soft.     Tenderness: There is no abdominal tenderness. There is no guarding or rebound.  Musculoskeletal: Normal range of motion.        General: No tenderness.     Right lower leg: Edema present.     Left lower leg: Edema present.     Comments: 2+ bilateral lower extremity edema.  Lymphadenopathy:     Cervical: No cervical adenopathy.  Skin:    General: Skin is warm and dry.     Findings: No erythema or rash.  Neurological:     General: No focal deficit present.     Mental Status: He is alert and oriented to person, place, and time.  Psychiatric:        Behavior: Behavior normal.      ED Treatments / Results  Labs (all labs ordered are listed, but only abnormal results are displayed) Labs Reviewed  BASIC METABOLIC PANEL - Abnormal; Notable for the following components:       Result Value   CO2 35 (*)    Glucose, Bld 178 (*)    Anion gap <3 (*)    All other components within normal limits  BRAIN NATRIURETIC PEPTIDE - Abnormal; Notable for the following components:   B Natriuretic Peptide 371.0 (*)    All other components within normal limits  TROPONIN I (HIGH SENSITIVITY) - Abnormal; Notable for the following components:   Troponin I (High  Sensitivity) 57 (*)    All other components within normal limits  TROPONIN I (HIGH SENSITIVITY) - Abnormal; Notable for the following components:   Troponin I (High Sensitivity) 76 (*)    All other components within normal limits  SARS CORONAVIRUS 2 (HOSPITAL ORDER, [place] [facility])  CBC  MAGNESIUM  HIV ANTIBODY (ROUTINE TESTING W REFLEX)  HIV4GL SAVE TUBE  COMPREHENSIVE METABOLIC PANEL  CBC    EKG EKG Interpretation  Date/Time:  Wednesday March 26 2019 20:05:02 EDT Ventricular Rate:  123 PR Interval:    QRS Duration: 112 QT Interval:  342 QTC Calculation: 489 R Axis:   110 Text Interpretation:  Atrial fibrillation with rapid ventricular response with occasional ventricular-paced complexes Right axis deviation Incomplete right bundle branch block Cannot rule out Anterior infarct , age undetermined T wave abnormality, consider inferior ischemia Abnormal ECG Confirmed by Julianne Rice [phone]) on 03/26/2019 9:11:46 PM   Radiology Dg Chest 2 View  Result Date: 03/26/2019 CLINICAL DATA:  Chest pain EXAM: CHEST - 2 VIEW COMPARISON:  08/29/2018 FINDINGS: Left-sided single lead pacing device as before. Cardiomegaly with vascular congestion. Diffuse slightly reticular interstitial opacity. No pneumothorax. Linear metallic opacity or wire fragments over the chest. IMPRESSION: 1. Cardiomegaly with vascular congestion 2. Diffuse bilateral interstitial and slightly reticular opacity suggesting component of chronic interstitial lung disease. There may be mild acute superimposed interstitial  edema. Electronically Signed   By: Donavan Foil M.D.   On: 03/26/2019 20:27    Procedures Procedures (including critical care time)  Medications Ordered in ED Medications  sodium chloride flush (NS) 0.9 % injection 3 mL (has no administration in time range)  furosemide (LASIX) injection 40 mg (has no administration in time range)  magnesium sulfate IVPB 2 g 50 mL (has no administration in time range)  amiodarone (NEXTERONE PREMIX) 360-4.14 MG/200ML-% (1.8 mg/mL) IV infusion (has no administration in time range)    Followed by  amiodarone (NEXTERONE PREMIX) 360-4.14 MG/200ML-% (1.8 mg/mL) IV infusion (has no administration in time range)  amiodarone (NEXTERONE) 1.8 mg/mL load via infusion 150 mg (has no administration in time range)     Initial Impression / Assessment and Plan / ED Course  I have reviewed the triage vital signs and the nursing notes.  Pertinent labs & imaging results that were available during my care of the patient were reviewed by me and considered in my medical decision making (see chart for details).        Patient defibrillator interrogated.  Appears to have 8 shocks due to A. fib with RVR.  Discussed with cardiology fellow on-call.  Recommend starting patient on metoprolol p.o. 50 mg twice daily.  Also recommends having cardiology see the patient in the morning for possible alterations to his defibrillator settings.  Patient continues to be in A. fib though heart rate is less than 100.  Blood pressure stable.  Mild elevation of troponin which was discussed with cardiology.  Think this is likely due to the defibrillation.  Recommend a 6-hour repeat.  Discussed with hospitalist who will see patient in the emergency department and admit.  Final Clinical Impressions(s) / ED Diagnoses   Final diagnoses:  Atrial fibrillation with RVR [facility])    ED Discharge Orders         Ordered    Amb referral to AFIB Clinic     03/26/19 2333           Julianne Rice, MD  03/26/19 2334

## 2019-03-26 NOTE — ED Notes (Signed)
Spoke with representative from [place]. Rep reports that ICD monitor shows 8 shocks delivered to patient around 1720 this afternoon for A. Fib with RVR. Asked rep to fax over this report and transferred call to Dr. Lita Mains to make him aware of the same.

## 2019-03-26 NOTE — ED Notes (Addendum)
Called [employer] main number [phone]. They said that they will have the local area representative give a call for readings on ICD.

## 2019-03-27 DIAGNOSIS — T82198A Other mechanical complication of other cardiac electronic device, initial encounter: Secondary | ICD-10-CM | POA: Diagnosis not present

## 2019-03-27 DIAGNOSIS — Z720 Tobacco use: Secondary | ICD-10-CM

## 2019-03-27 DIAGNOSIS — I5042 Chronic combined systolic (congestive) and diastolic (congestive) heart failure: Secondary | ICD-10-CM | POA: Diagnosis not present

## 2019-03-27 DIAGNOSIS — G4733 Obstructive sleep apnea (adult) (pediatric): Secondary | ICD-10-CM | POA: Diagnosis not present

## 2019-03-27 DIAGNOSIS — I10 Essential (primary) hypertension: Secondary | ICD-10-CM | POA: Diagnosis not present

## 2019-03-27 DIAGNOSIS — I4891 Unspecified atrial fibrillation: Secondary | ICD-10-CM | POA: Diagnosis not present

## 2019-03-27 LAB — CBC
HCT: 42.7 % (ref 39.0–52.0)
Hemoglobin: 13.4 g/dL (ref 13.0–17.0)
MCH: 30.2 pg (ref 26.0–34.0)
MCHC: 31.4 g/dL (ref 30.0–36.0)
MCV: 96.4 fL (ref 80.0–100.0)
Platelets: 289 10*3/uL (ref 150–400)
RBC: 4.43 MIL/uL (ref 4.22–5.81)
RDW: 15.2 % (ref 11.5–15.5)
WBC: 7.8 10*3/uL (ref 4.0–10.5)
nRBC: 0 % (ref 0.0–0.2)

## 2019-03-27 LAB — HIV ANTIBODY (ROUTINE TESTING W REFLEX): HIV Screen 4th Generation wRfx: NONREACTIVE

## 2019-03-27 LAB — MRSA PCR SCREENING: MRSA by PCR: NEGATIVE

## 2019-03-27 MED ORDER — CARVEDILOL 12.5 MG PO TABS
25.0000 mg | ORAL_TABLET | Freq: Two times a day (BID) | ORAL | Status: DC
  Administered 2019-03-27: 09:00:00 25 mg via ORAL
  Filled 2019-03-27: qty 2

## 2019-03-27 MED ORDER — LISINOPRIL 10 MG PO TABS: 40.0000 mg | ORAL_TABLET | Freq: Every day | ORAL | Status: DC

## 2019-03-27 MED ORDER — DIAZEPAM 5 MG PO TABS
10.0000 mg | ORAL_TABLET | Freq: Two times a day (BID) | ORAL | Status: DC
  Administered 2019-03-27: 09:00:00 10 mg via ORAL
  Filled 2019-03-27: qty 2

## 2019-03-27 MED ORDER — BUPRENORPHINE HCL-NALOXONE HCL 4-1 MG SL FILM: 1.5000 | ORAL_FILM | SUBLINGUAL | Status: DC

## 2019-03-27 MED ORDER — CHLORHEXIDINE GLUCONATE CLOTH 2 % EX PADS: 6.0000 | MEDICATED_PAD | Freq: Every day | CUTANEOUS | Status: DC

## 2019-03-27 MED ORDER — GABAPENTIN 600 MG PO TABS: 300.0000 mg | ORAL_TABLET | Freq: Four times a day (QID) | ORAL | Status: DC

## 2019-03-27 MED ORDER — GABAPENTIN 300 MG PO CAPS
600.0000 mg | ORAL_CAPSULE | Freq: Four times a day (QID) | ORAL | Status: DC
  Administered 2019-03-27: 600 mg via ORAL
  Filled 2019-03-27: qty 2

## 2019-03-27 MED ORDER — LEVALBUTEROL HCL 0.63 MG/3ML IN NEBU: 0.6300 mg | INHALATION_SOLUTION | Freq: Four times a day (QID) | RESPIRATORY_TRACT | Status: DC | PRN

## 2019-03-27 MED ORDER — SPIRONOLACTONE 25 MG PO TABS
25.0000 mg | ORAL_TABLET | Freq: Every day | ORAL | Status: DC
  Administered 2019-03-27: 25 mg via ORAL
  Filled 2019-03-27: qty 1

## 2019-03-27 MED ORDER — BUPRENORPHINE HCL-NALOXONE HCL 2-0.5 MG SL SUBL
2.0000 | SUBLINGUAL_TABLET | Freq: Every day | SUBLINGUAL | Status: DC
  Administered 2019-03-27: 2 via SUBLINGUAL
  Filled 2019-03-27: qty 2

## 2019-03-27 MED ORDER — DULOXETINE HCL 30 MG PO CPEP
30.0000 mg | ORAL_CAPSULE | Freq: Every day | ORAL | Status: DC
  Administered 2019-03-27: 09:00:00 30 mg via ORAL
  Filled 2019-03-27: qty 1

## 2019-03-27 MED ORDER — MIRTAZAPINE 30 MG PO TABS: 30.0000 mg | ORAL_TABLET | Freq: Every day | ORAL | Status: DC

## 2019-03-27 MED ORDER — RIVAROXABAN 20 MG PO TABS: 20.0000 mg | ORAL_TABLET | Freq: Every day | ORAL | Status: DC

## 2019-03-27 MED ORDER — GABAPENTIN 600 MG PO TABS
600.0000 mg | ORAL_TABLET | Freq: Four times a day (QID) | ORAL | Status: DC
  Filled 2019-03-27 (×6): qty 1

## 2019-03-27 MED ORDER — FUROSEMIDE 40 MG PO TABS
40.0000 mg | ORAL_TABLET | Freq: Every day | ORAL | Status: DC
  Administered 2019-03-27: 09:00:00 40 mg via ORAL
  Filled 2019-03-27: qty 1

## 2019-03-27 NOTE — Progress Notes (Signed)
Per Dr. Olevia Bowens, pt was allowed to take his prescribed night meds that he brought with him. Pt took the following:  buspar Melatonin remron Gabapentin  Advised patient of policy.

## 2019-03-27 NOTE — ED Notes (Signed)
Pt has been informed that his oxygen levels are dropping when he removes oxygen. He states that he drops to the 70s-80% at home. He refuses to leave nasal cannula on when he stands up to urinate. Pt educated but remains noncompliant.

## 2019-03-27 NOTE — H&P (Signed)
History and Physical    Vernon Nelson [id number] DOB: [birth year] DOA: 03/26/2019  PCP: Wyatt Haste, NP   Patient coming from: Home.  I have personally briefly reviewed patient's old medical records in [place]  Chief Complaint: Defibrillator shock x6.  HPI: Vernon Nelson is a 63 y.o. male with medical history significant of chronic atrial fibrillation, cervical spondylosis, nonalcoholic cirrhosis, COPD on home oxygen, hypertension, morbid obesity, nonischemic cardiomyopathy, chronic combined systolic and diastolic CHF, OSA on CPAP, osteoarthritis of both knees who is coming to the emergency department due to having his defibrillator shocking him 6 times.  Upon review of his telemetry, he has been having atrial fibrillation with RVR.  The patient mentions that he was at the grocery store when he felt multiple shocks from his AICD.  He states that he felt shaky and has some diaphoresis.  The last one occurred while he was out the line paying for his groceries.  This was so intense that he has bit his tongue.  He then went home to rest and felt it there once more.  Following this, he decided to come to the emergency department.  He denies chest pain, palpitations, dizziness, PND or orthopnea.  He occasionally gets lower extremity edema.  Denies fever, chills, headache, rhinorrhea, sore throat, wheezing or hemoptysis.  He denies abdominal pain, nausea, emesis, diarrhea, melena or hematochezia.  He occasionally gets constipation.  No dysuria, frequency or hematuria.  Denies polyuria, polydipsia, polyphagia or blurred vision.  ED Course: Initial vital signs were temperature 98.3 F, pulse 88, respiration 18, blood pressure 140/97 mmHg and O2 sat 100% on room air.  The patient was given furosemide 40 mg IVP x1.  Cardiology started him on amiodarone infusion.  I ordered magnesium sulfate 1 g IVPB.  His CBC was normal.  Troponin was 57 and and 76 ng/L.  BNP was 371.0 pg/mL.  Electrolytes were  normal, except for CO2 of 35 mmol/L.  Blood glucose 178 mg/dL.  This chest radiograph shows cardiomegaly with vascular congestion.  There is diffuse bilateral interstitial and slightly reticular opacities suggesting component of chronic lung disease.  Please see images and full radiology report for further detail.  Review of Systems: As per HPI otherwise 10 point review of systems negative.   Past Medical History:  Diagnosis Date  . Atrial fibrillation ([place])   . Cervical spondylolysis   . Cirrhosis, non-alcoholic ([place])   . COPD (chronic obstructive pulmonary disease) ([place])    on home O2  . HTN (hypertension)   . Morbid obesity ([place])   . NICM (nonischemic cardiomyopathy) ([place])    a. normal cors by cath in 2016, EF 35-40% by echo in 2017, s/p ICD placement  . OSA (obstructive sleep apnea)    uses CPAP  . Osteoarthritis of both knees     Past Surgical History:  Procedure Laterality Date  . ICD IMPLANT  2015/2016   INDIAN PATH BRISTOL TN DR MCQUEEN ([facility])     reports that he has been smoking cigarettes. He has smoked for the past 20.00 years. He has never used smokeless tobacco. He reports that he does not drink alcohol or use drugs.  No Known Allergies  Family History  Problem Relation Age of Onset  . Kidney disease Father    Prior to Admission medications   Medication Sig Start Date End Date Taking? Authorizing Provider  carvedilol (COREG) 25 MG tablet Take 25 mg by mouth 2 (two) times daily.    [provider]  diazepam (VALIUM) 10 MG tablet Take 10 mg by mouth 2 (two) times daily.    [provider]  DULoxetine (CYMBALTA) 30 MG capsule Take 30 mg by mouth daily.     [provider]  furosemide (LASIX) 40 MG tablet Take 40 mg by mouth daily.    [provider]  gabapentin (NEURONTIN) 600 MG tablet Take 600 mg by mouth 4 (four) times daily.     [provider]  hydrALAZINE (APRESOLINE) 25 MG tablet TAKE 1 TABLET BY MOUTH THREE  TIMES DAILY Patient taking differently: Take 40 mg by mouth 3 (three) times daily.  05/14/18   Herminio Commons, MD  lisinopril (ZESTRIL) 40 MG tablet TAKE 1 TABLET BY MOUTH EVERY DAY 03/18/19   Herminio Commons, MD  mirtazapine (REMERON) 30 MG tablet Take 30 mg by mouth at bedtime.    [provider]  rivaroxaban (XARELTO) 20 MG TABS tablet Take 20 mg by mouth daily.    [provider]  spironolactone (ALDACTONE) 25 MG tablet Take 25 mg by mouth daily.    [provider]    Physical Exam: Vitals:   03/26/19 2002  BP: (!) 148/97  Pulse: 88  Resp: 18  Temp: 98.3 F (36.8 C)  TempSrc: Oral  SpO2: 100%    Constitutional: NAD, calm, comfortable Eyes: PERRL, lids and conjunctivae normal ENMT: Mucous membranes are moist. Posterior pharynx clear of any exudate or lesions Neck: normal, supple, no masses, no thyromegaly Respiratory: Mild bilateral rhonchi lower lung fields, no wheezing or crackles. Normal respiratory effort. No accessory muscle use.  Cardiovascular: Tachycardic in the low 100s with an irregularly irregular rhythm, no murmurs / rubs / gallops.  1+ bilateral lower extremities pitting edema. 2+ pedal pulses. No carotid bruits.  Abdomen: Obese, nondistended. Bowel sounds positive.  Soft, no tenderness, no masses palpated. No hepatosplenomegaly.  Musculoskeletal: no clubbing / cyanosis. Good ROM, no contractures. Normal muscle tone.  Skin: no rashes, lesions, ulcers. No induration Neurologic: CN 2-12 grossly intact. Sensation intact, DTR normal. Strength 5/5 in all 4.  Psychiatric: Normal judgment and insight. Alert and oriented x 3. Normal mood.   Labs on Admission: I have personally reviewed following labs and imaging studies  CBC: Recent Labs  Lab 03/26/19 2045  WBC 10.3  HGB 13.3  HCT 41.9  MCV 97.0  PLT [id number]   Basic Metabolic Panel: Recent Labs  Lab 03/26/19 2045  NA 135  K 4.4  CL 98  CO2 35*  GLUCOSE 178*  BUN 12   CREATININE 1.20  CALCIUM 8.9  MG 2.0   GFR: CrCl cannot be calculated (Unknown ideal weight.). Liver Function Tests: No results for input(s): AST, ALT, ALKPHOS, BILITOT, PROT, ALBUMIN in the last 168 hours. No results for input(s): LIPASE, AMYLASE in the last 168 hours. No results for input(s): AMMONIA in the last 168 hours. Coagulation Profile: No results for input(s): INR, PROTIME in the last 168 hours. Cardiac Enzymes: No results for input(s): CKTOTAL, CKMB, CKMBINDEX, TROPONINI in the last 168 hours. BNP (last 3 results) No results for input(s): PROBNP in the last 8760 hours. HbA1C: No results for input(s): HGBA1C in the last 72 hours. CBG: No results for input(s): GLUCAP in the last 168 hours. Lipid Profile: No results for input(s): CHOL, HDL, LDLCALC, TRIG, CHOLHDL, LDLDIRECT in the last 72 hours. Thyroid Function Tests: No results for input(s): TSH, T4TOTAL, FREET4, T3FREE, THYROIDAB in the last 72 hours. Anemia Panel: No results for input(s): VITAMINB12,  FOLATE, FERRITIN, TIBC, IRON, RETICCTPCT in the last 72 hours. Urine analysis: No results found for: COLORURINE, APPEARANCEUR, LABSPEC, [place], GLUCOSEU, HGBUR, BILIRUBINUR, KETONESUR, PROTEINUR, UROBILINOGEN, NITRITE, LEUKOCYTESUR  Radiological Exams on Admission: Dg Chest 2 View  Result Date: 03/26/2019 CLINICAL DATA:  Chest pain EXAM: CHEST - 2 VIEW COMPARISON:  08/29/2018 FINDINGS: Left-sided single lead pacing device as before. Cardiomegaly with vascular congestion. Diffuse slightly reticular interstitial opacity. No pneumothorax. Linear metallic opacity or wire fragments over the chest. IMPRESSION: 1. Cardiomegaly with vascular congestion 2. Diffuse bilateral interstitial and slightly reticular opacity suggesting component of chronic interstitial lung disease. There may be mild acute superimposed interstitial edema. Electronically Signed   By: Donavan Foil M.D.   On: 03/26/2019 20:27   01/09/2019 echocardiogram    1. Images are limited.  2. The left ventricle has mild-moderately reduced systolic function, with an ejection fraction of 40-45%. The cavity size was normal. There is mildly increased left ventricular wall thickness. Left ventricular diastolic Doppler parameters are  indeterminate in the setting of rapid atrial fibrillation. Left ventricular diffuse hypokinesis.  3. The right ventricle has normal systolic function. The cavity was mildly enlarged. There is no increase in right ventricular wall thickness. Device wire present.  4. Left atrial size was moderately dilated.  5. The aortic valve is tricuspid. Mild aortic annular calcification noted.  6. The mitral valve is degenerative. Mild thickening of the mitral valve leaflet. Mild calcification of the mitral valve leaflet.  7. The tricuspid valve is grossly normal.  8. The aorta is normal in size and structure.  9. The interatrial septum was not well visualized.  EKG: Independently reviewed.  Vent. rate 123 BPM PR interval * ms QRS duration 112 ms QT/QTc 342/489 ms P-R-T axes * 110 -46 Atrial fibrillation with rapid ventricular response with occasional ventricular-paced complexes Right axis deviation Incomplete right bundle branch block Cannot rule out Anterior infarct , age undetermined T wave abnormality, consider inferior ischemia Abnormal ECG  Assessment/Plan Principal Problem:   Atrial fibrillation with RVR ([place]) Started on amiodarone by cardiology. Continue supplemental oxygen. Continue heparin infusion. Continue carvedilol 25 mg p.o. daily. Keep electrolytes optimized. Consult cardiology in a.m.  Active Problems:   Chronic combined systolic and diastolic heart failure (H[facility]) Received furosemide in the emergency department. Supplemental oxygen as needed. Monitor intake and output. Monitor daily weights.    HTN (hypertension) Continue carvedilol 25 mg p.o. daily. Continue lisinopril 40 mg p.o. daily. Continue furosemide  40 mg p.o. daily. Hold hydralazine to avoid tachycardia. Monitor BP, HR, renal function electrolytes.    OSA (obstructive sleep apnea) Continue CPAP at bedtime.    Morbid obesity ([place]) Needs significant lifestyle modifications.    COPD (chronic obstructive pulmonary disease) ([place]) Supplemental oxygen as needed. Xopenex as needed.    Cirrhosis, non-alcoholic ([place]) No signs of decompensation at this time.    Tobacco use Nicotine replacement therapy offered. Staff to provide tobacco cessation information.   DVT prophylaxis: On Xarelto. Code Status: Full code. Family Communication: Disposition Plan: Observation overnight for amiodarone loading.  Cardiology evaluation in a.m. Consults called: Admission status: Observation/stepdown.   Reubin Milan MD [facility]  If 7PM-7AM, please contact night-coverage  03/26/2019, 11:39 PM   This document was prepared using Dragon voice recognition software and may contain some unintended transcription errors.

## 2019-03-27 NOTE — Consult Note (Addendum)
Cardiology Consult    Patient ID: Vernon Nelson; [id number]; 1955-10-30   Admit date: 03/26/2019 Date of Consult: 03/27/2019  Primary Care Provider: Wyatt Haste, NP Primary Cardiologist: Kate Sable, MD  Primary Electrophysiologist:  Dr. Rayann Heman  Patient Profile    Vernon Nelson is a 63 y.o. male with past medical history of chronic atrial fibrillation, chronic combined systolic and diastolic CHF/NICM (EF [id number] in 2017 with cath showing normal cors in 2016 and s/p Biotronik ICD placement, EF 40-45% by repeat imaging in 12/2018), HTN, OSA, and COPD who is being seen today for the evaluation of inappropriate ICD firing and atrial fibrillation with RVR at the request of Dr. Olevia Bowens.   History of Present Illness    Vernon Nelson had a telehealth visit with myself in 11/2018 after having not been evaluated in the office in over 2 years.  He reported progressive dyspnea on exertion over the past year but denied any chest pain or palpitations.  His weight had increased by 20 pounds per his report.  Was only taking Lasix as needed but reported good compliance with Coreg, Hydralazine, Lisinopril, Spironolactone and Xarelto.  His dyspnea was felt to be multifactorial in the setting of cardiomyopathy, COPD and obesity with a follow-up echocardiogram being obtained which showed his EF had improved slightly to 40 to 45%.  Had not been interrogated within the past few years and follow-up with EP was arranged.   He called the office in 11/2018 reporting that he felt his ICD had fired.  It was recommended he go to the ED for further evaluation but he declined at that time.  He did have a telehealth visit in 01/2019 with Dr. Rayann Heman but did not have a remote monitor to check his device.  A device check in office was recommended for the following week but it appears he canceled his appointment.   He presented to [facility] ED yesterday evening after reporting his device shocked him 6 times throughout the day.  He reports being in his usual state of health until yesterday afternoon when he developed significant diaphoresis and palpitations. Was at the grocery store when his device started going off. Says it felt as if something was kicking him in the chest.  He had recurrent ICD firings in the evening which prompted him to come to the ED.  His device was interrogated in the ED and by review of notes he had 8 shocks due to atrial fibrillation with RVR. This was discussed with the Cardiology fellow on call and it was recommended to start IV Amiodarone and to have EP evaluation the following morning to address possible changes in his device settings.   Initial labs showed WBC 10.3, Hgb 13.3, platelets 304, Na+ 135, K+ 4.4 and creatinine 1.20. BNP 371. Mg 2.0. COVID negative. Initial and delta HS Troponin 57 and 76.  CXR showed cardiomegaly and vascular congestion with diffuse bilateral interstitial reticular opacity suggesting chronic interstitial lung disease. EKG shows atrial fibrillation with RVR, HR 123 and incomplete RBBB.   By review of telemetry he remains in atrial fibrillation but rates have improved into the 90s to low 100s.  He is having frequent PVCs and what appears to be runs of NSVT.  There are some paced beats appreciated but other episodes of NSVT do not have clearly identified pacer spikes.  He reports feeling "great" this morning. No chest pain or palpitations. Breathing back to baseline.    Past Medical History:  Diagnosis Date  Atrial fibrillation ([place])    Cervical spondylolysis    Cirrhosis, non-alcoholic ([facility])    COPD (chronic obstructive pulmonary disease) ([place])    on home O2   HTN (hypertension)    Morbid obesity (H[facility])    NICM (nonischemic cardiomyopathy) ([place])    a. normal cors by cath in 2016, EF 35-40% by echo in 2017, s/p ICD placement   OSA (obstructive sleep apnea)    uses CPAP   Osteoarthritis of both knees     Past Surgical History:  Procedure Laterality  Date   ICD IMPLANT  2015/2016   [facility]ST[facility] [place] DR MCQUEEN (Biotronik)     Home Medications:  Prior to Admission medications   Medication Sig Start Date End Date Taking? Authorizing Provider  carvedilol (COREG) 25 MG tablet Take 25 mg by mouth 2 (two) times daily.    [provider]  diazepam (VALIUM) 10 MG tablet Take 10 mg by mouth 2 (two) times daily.    [provider]  DULoxetine (CYMBALTA) 30 MG capsule Take 30 mg by mouth daily.     [provider]  furosemide (LASIX) 40 MG tablet Take 40 mg by mouth daily.    [provider]  gabapentin (NEURONTIN) 600 MG tablet Take 600 mg by mouth 4 (four) times daily.     [provider]  hydrALAZINE (APRESOLINE) 25 MG tablet TAKE 1 TABLET BY MOUTH THREE TIMES DAILY Patient taking differently: Take 40 mg by mouth 3 (three) times daily.  05/14/18   Herminio Commons, MD  lisinopril (ZESTRIL) 40 MG tablet TAKE 1 TABLET BY MOUTH EVERY DAY 03/18/19   Herminio Commons, MD  mirtazapine (REMERON) 30 MG tablet Take 30 mg by mouth at bedtime.    [provider]  rivaroxaban (XARELTO) 20 MG TABS tablet Take 20 mg by mouth daily.    [provider]  spironolactone (ALDACTONE) 25 MG tablet Take 25 mg by mouth daily.    [provider]    Inpatient Medications: Scheduled Meds:  carvedilol  25 mg Oral BID AC   Chlorhexidine Gluconate Cloth  6 each Topical Daily   diazepam  10 mg Oral BID   DULoxetine  30 mg Oral Daily   furosemide  40 mg Oral Daily   gabapentin  600 mg Oral QID   lisinopril  40 mg Oral Daily   mirtazapine  30 mg Oral QHS   rivaroxaban  20 mg Oral Q supper   spironolactone  25 mg Oral Daily   Continuous Infusions:  amiodarone 30 mg/hr (03/27/19 0530)   PRN Meds:   Allergies:   No Known Allergies  Social History:   Social History   Socioeconomic History   Marital status: Single    Spouse name: Not on file   Number of children:  Not on file   Years of education: Not on file   Highest education level: Not on file  Occupational History   Not on file  Social Needs   Financial resource strain: Not on file   Food insecurity    Worry: Not on file    Inability: Not on file   Transportation needs    Medical: Not on file    Non-medical: Not on file  Tobacco Use   Smoking status: Current Every Day Smoker    Years: 20.00    Types: Cigarettes   Smokeless tobacco: Never Used  Substance and Sexual Activity   Alcohol use: No   Drug use: No  Sexual activity: Not on file  Lifestyle   Physical activity    Days per week: Not on file    Minutes per session: Not on file   Stress: Not on file  Relationships   Social connections    Talks on phone: Not on file    Gets together: Not on file    Attends religious service: Not on file    Active member of club or organization: Not on file    Attends meetings of clubs or organizations: Not on file    Relationship status: Not on file   Intimate partner violence    Fear of current or ex partner: Not on file    Emotionally abused: Not on file    Physically abused: Not on file    Forced sexual activity: Not on file  Other Topics Concern   Not on file  Social History Narrative   Lives in [place]   disabled     Family History:    Family History  Problem Relation Age of Onset   Kidney disease Father       Review of Systems    General:  No chills, fever, night sweats or weight changes.  Cardiovascular:  No chest pain, paroxysmal nocturnal dyspnea. Positive for palpitations and dyspnea.  Dermatological: No rash, lesions/masses Respiratory: No cough, dyspnea Urologic: No hematuria, dysuria Abdominal:   No nausea, vomiting, diarrhea, bright red blood per rectum, melena, or hematemesis Neurologic:  No visual changes, wkns, changes in mental status. All other systems reviewed and are otherwise negative except as noted above.  Physical Exam/Data      Vitals:   03/27/19 0300 03/27/19 0400 03/27/19 0500 03/27/19 0600  BP: 121/76 126/81 (!) 109/94 103/77  Pulse: (!) 112 85 82   Resp: 18 13    Temp:  97.6 F (36.4 C)    TempSrc:  Axillary    SpO2: (!) 86% 92% 96%   Weight:   133.7 kg   Height:        Intake/Output Summary (Last 24 hours) at 03/27/2019 0744 Last data filed at 03/27/2019 0024 Gross per 24 hour  Intake --  Output 1000 ml  Net -1000 ml   Filed Weights   03/27/19 0102 03/27/19 0500  Weight: 133.7 kg 133.7 kg   Body mass index is 34.95 kg/m.   General: Pleasant Caucasian male appearing in NAD Psych: Normal affect. Neuro: Alert and oriented X 3. Moves all extremities spontaneously. HEENT: Normal  Neck: Supple without bruits or JVD. Lungs:  Resp regular and unlabored, scattered rhonchi. Heart: Irregularly irregular. no s3, s4, or murmurs. Abdomen: Soft, non-tender, non-distended, BS + x 4.  Extremities: No clubbing, cyanosis or lower extremity edema. DP/PT/Radials 2+ and equal bilaterally.   EKG:  The EKG was personally reviewed and demonstrates: Atrial fibrillation with RVR, HR 123 and incomplete RBBB. TWI along inferior leads more prominent now when compared to prior tracings.   Telemetry:  Telemetry was personally reviewed and demonstrates: As above.    Labs/Studies     Relevant CV Studies:  Cardiac Catheterization: 2016   Echocardiogram: 01/09/2019 IMPRESSIONS    1. Images are limited.  2. The left ventricle has mild-moderately reduced systolic function, with an ejection fraction of 40-45%. The cavity size was normal. There is mildly increased left ventricular wall thickness. Left ventricular diastolic Doppler parameters are  indeterminate in the setting of rapid atrial fibrillation. Left ventricular diffuse hypokinesis.  3. The right ventricle has normal systolic function. The cavity was  mildly enlarged. There is no increase in right ventricular wall thickness. Device wire present.  4. Left  atrial size was moderately dilated.  5. The aortic valve is tricuspid. Mild aortic annular calcification noted.  6. The mitral valve is degenerative. Mild thickening of the mitral valve leaflet. Mild calcification of the mitral valve leaflet.  7. The tricuspid valve is grossly normal.  8. The aorta is normal in size and structure.  9. The interatrial septum was not well visualized.  Laboratory Data:  Chemistry Recent Labs  Lab 03/26/19 2045  NA 135  K 4.4  CL 98  CO2 35*  GLUCOSE 178*  BUN 12  CREATININE 1.20  CALCIUM 8.9  GFRNONAA >60  GFRAA >60  ANIONGAP <3*    No results for input(s): PROT, ALBUMIN, AST, ALT, ALKPHOS, BILITOT in the last 168 hours. Hematology Recent Labs  Lab 03/26/19 2045 03/27/19 0434  WBC 10.3 7.8  RBC 4.32 4.43  HGB 13.3 13.4  HCT 41.9 42.7  MCV 97.0 96.4  MCH 30.8 30.2  MCHC 31.7 31.4  RDW 15.3 15.2  PLT 304 289   Cardiac EnzymesNo results for input(s): TROPONINI in the last 168 hours. No results for input(s): TROPIPOC in the last 168 hours.  BNP Recent Labs  Lab 03/26/19 2045  BNP 371.0*    DDimer No results for input(s): DDIMER in the last 168 hours.  Radiology/Studies:  Dg Chest 2 View  Result Date: 03/26/2019 CLINICAL DATA:  Chest pain EXAM: CHEST - 2 VIEW COMPARISON:  08/29/2018 FINDINGS: Left-sided single lead pacing device as before. Cardiomegaly with vascular congestion. Diffuse slightly reticular interstitial opacity. No pneumothorax. Linear metallic opacity or wire fragments over the chest. IMPRESSION: 1. Cardiomegaly with vascular congestion 2. Diffuse bilateral interstitial and slightly reticular opacity suggesting component of chronic interstitial lung disease. There may be mild acute superimposed interstitial edema. Electronically Signed   By: Donavan Foil M.D.   On: 03/26/2019 20:27     Assessment & Plan    1. ICD Firing - Device interrogation upon admission showed he had been shocked a total of 8 times and it was  felt to be for inappropriate shocks in the setting of atrial fibrillation with RVR. He was started on IV Amiodarone upon admission and by review of telemetry he remains in atrial fibrillation and rates have improved into the 90's to low 100's but he is still having frequent ectopy. Appears to be having episodes of NSVT and while pacer spikes are noted at times these are not consistent with every run. Question if possibly due to aberrancy. Spoke with EP APP at [facility] who reviewed with Dr. Rayann Heman and it was felt the patient did not need to be transferred to [facility] at this time and they are sending the [employer] representative to [facility] at this time to reprogram his device.  - continue IV Amiodarone for now along with PTA Coreg. K+ 4.4 and Mg 2.0. Keep K+ ~ 4.0 and Mg ~ 2.0.  2. Chronic Atrial Fibrillation - Presented in atrial fibrillation with RVR but reports compliance with his medications as an outpatient.  Rates have improved with the use of IV Amiodarone. Would continue with this currently along with PTA Coreg 25mg  BID. - He denies any evidence of active bleeding and hemoglobin is stable at 13.3. Continue Xarelto for anticoagulation.   3. Chronic Combined Systolic and Diastolic CHF/NICM - EF previously 35-40% in 2017 with cath showing normal cors in 2016. EF 40-45% by repeat imaging in  12/2018. He denies any recent dyspnea on exertion, orthopnea or PND and reports weight has been stable on his home scales. Continue PTA Lasix 40mg  daily.  - he is on Coreg, Hydralazine, Lisinopril, and Spironolactone as an outpatient. Hydralazine and Lisinopril currently held due to soft BP. Plan to resume prior to discharge if BP allows.   4. OSA - continued compliance with CPAP encouraged.   5. Tobacco Use  - he continues to smoke ~ 1.0 ppd. Cessation advised.     For questions or updates, please contact [place] Please consult [email or web address] for contact info under  Cardiology/STEMI.  Signed, Erma Heritage, PA-C 03/27/2019, 7:44 AM Pager: [phone]  The patient was seen and examined, and I agree with the history, physical exam, assessment and plan as documented above, with modifications as noted below. I have also personally reviewed all relevant documentation, old records, labs, and both radiographic and cardiovascular studies. I have also independently interpreted old and new ECG's.  Briefly, this is a 63 year old male whom I have not evaluated in some time.  He has chronic atrial fibrillation and chronic combined heart failure and has an ICD.  Most recent echocardiogram in July 2020 showed EF 40 to 45%.  He presented with several ICD shocks.  He was started on IV amiodarone.  I just spoke with representative and looked at the device interrogation.    The ICD was inappropriately firing for rapid atrial fibrillation.  The wide-complex beats seen on telemetry with paced beats.  It appears the previous settings were "suboptimal".  He has been reprogrammed.  Heart rate is currently in the 70 bpm range.  He is feeling well and is eager to go home.  He has been noncompliant with medications in the outpatient setting.  I would continue carvedilol 25 mg twice daily along with spironolactone.  Hydralazine and lisinopril are currently on hold due to soft blood pressures but these can be resumed if blood pressure permits.  Continue Xarelto for anticoagulation.  Continue Lasix 40 mg daily for chronic combined heart failure.    Kate Sable, MD, [facility]  03/27/2019 9:44 AM

## 2019-03-27 NOTE — Progress Notes (Signed)
Reviewed d/c instructions, follow up care, and medication changes with pt. He denies any questions or concerns at this time. Pt refused to wear oxygen or stay in room with oxygen while awaiting his transport home. Advised pt importance of oxygen during wait time. Once again refused. Pt to d/c with [facility] via [facility].

## 2019-03-27 NOTE — Progress Notes (Signed)
PROGRESS NOTE    Vernon Nelson  [id number] DOB: Mar 13, 1956 DOA: 03/26/2019 PCP: Wyatt Haste, NP   Brief Narrative:  Per HPI: Vernon Nelson is a 63 y.o. male with medical history significant of chronic atrial fibrillation, cervical spondylosis, nonalcoholic cirrhosis, COPD on home oxygen, hypertension, morbid obesity, nonischemic cardiomyopathy, chronic combined systolic and diastolic CHF, OSA on CPAP, osteoarthritis of both knees who is coming to the emergency department due to having his defibrillator shocking him 6 times.  Upon review of his telemetry, he has been having atrial fibrillation with RVR.  The patient mentions that he was at the grocery store when he felt multiple shocks from his AICD.  He states that he felt shaky and has some diaphoresis.  The last one occurred while he was out the line paying for his groceries.  This was so intense that he has bit his tongue.  He then went home to rest and felt it there once more.  Following this, he decided to come to the emergency department.  He denies chest pain, palpitations, dizziness, PND or orthopnea.  He occasionally gets lower extremity edema.  Denies fever, chills, headache, rhinorrhea, sore throat, wheezing or hemoptysis.  He denies abdominal pain, nausea, emesis, diarrhea, melena or hematochezia.  He occasionally gets constipation.  No dysuria, frequency or hematuria.  Denies polyuria, polydipsia, polyphagia or blurred vision.  10/8: Patient has been started on amiodarone drip per cardiology recommendations, but will need device interrogation.  Low threshold for transfer to [facility] for EP evaluation should the patient continue to have ongoing arrhythmias.  Heart rate appears to be controlled on amiodarone drip currently.  Assessment & Plan:   Principal Problem:   Atrial fibrillation with RVR (HCC) Active Problems:   Chronic combined systolic and diastolic heart failure (HCC)   HTN (hypertension)   OSA (obstructive sleep  apnea)   Morbid obesity (HCC)   COPD (chronic obstructive pulmonary disease) (HCC)   Cirrhosis, non-alcoholic (HCC)   Tobacco use   Atrial fibrillation with RVR status post ICD firing -Currently appears rate controlled on amiodarone drip, continue telemetry -Continue on Xarelto for anticoagulation -Continue Coreg 25 mg p.o. daily -Appreciate cardiology evaluation and recommendations for transfer to [facility] for EP evaluation onto cardiology service  Chronic combined systolic and diastolic heart failure -Does not appear to be an acute exacerbation, but did receive furosemide in ED -Monitor daily weights and I's and O's  Hypertension -Continue carvedilol 25 mg p.o. daily -Continue furosemide 40 mg p.o. daily -Hold hydralazine as well as lisinopril due to some soft blood pressure readings  OSA -Continued on CPAP at bedtime  COPD -No acute bronchospasms currently noted -Xopenex as needed for shortness of breath or wheezing  Morbid obesity  Tobacco abuse -Cessation counseling provided -Nicotine patch offered  DVT prophylaxis: Xarelto Code Status: Full Family Communication: Patient informed Disposition Plan: Continue amiodarone drip with further management per cardiology recommendations.  Device to be interrogated here, but anticipate transfer to [facility] for EP evaluation should patient continue to have arrhythmia.   Consultants:   Cardiology  Procedures:   None  Antimicrobials:   None   Subjective: Patient seen and evaluated today with no new acute complaints or concerns. No acute concerns or events noted overnight.  He denies any shortness of breath, chest pain or palpitations currently.  Objective: Vitals:   03/27/19 0300 03/27/19 0400 03/27/19 0500 03/27/19 0600  BP: 121/76 126/81 (!) 109/94 103/77  Pulse: (!) 112 85 82   Resp:  18 13    Temp:  97.6 F (36.4 C)    TempSrc:  Axillary    SpO2: (!) 86% 92% 96%   Weight:   133.7 kg   Height:         Intake/Output Summary (Last 24 hours) at 03/27/2019 0808 Last data filed at 03/27/2019 0024 Gross per 24 hour  Intake -  Output 1000 ml  Net -1000 ml   Filed Weights   03/27/19 0102 03/27/19 0500  Weight: 133.7 kg 133.7 kg    Examination:  General exam: Appears calm and comfortable  Respiratory system: Clear to auscultation. Respiratory effort normal. Cardiovascular system: S1 & S2 heard, irregular. No JVD, murmurs, rubs, gallops or clicks. No pedal edema. Gastrointestinal system: Abdomen is nondistended, soft and nontender. No organomegaly or masses felt. Normal bowel sounds heard. Central nervous system: Alert and awake Extremities: Symmetric 5 x 5 power. Skin: No rashes, lesions or ulcers Psychiatry: Judgement and insight appear normal. Mood & affect appropriate.     Data Reviewed: I have personally reviewed following labs and imaging studies  CBC: Recent Labs  Lab 03/26/19 2045 03/27/19 0434  WBC 10.3 7.8  HGB 13.3 13.4  HCT 41.9 42.7  MCV 97.0 96.4  PLT 304 [id number]   Basic Metabolic Panel: Recent Labs  Lab 03/26/19 2045  NA 135  K 4.4  CL 98  CO2 35*  GLUCOSE 178*  BUN 12  CREATININE 1.20  CALCIUM 8.9  MG 2.0   GFR: Estimated Creatinine Clearance: 92.8 mL/min (by C-G formula based on SCr of 1.2 mg/dL). Liver Function Tests: No results for input(s): AST, ALT, ALKPHOS, BILITOT, PROT, ALBUMIN in the last 168 hours. No results for input(s): LIPASE, AMYLASE in the last 168 hours. No results for input(s): AMMONIA in the last 168 hours. Coagulation Profile: No results for input(s): INR, PROTIME in the last 168 hours. Cardiac Enzymes: No results for input(s): CKTOTAL, CKMB, CKMBINDEX, TROPONINI in the last 168 hours. BNP (last 3 results) No results for input(s): PROBNP in the last 8760 hours. HbA1C: No results for input(s): HGBA1C in the last 72 hours. CBG: No results for input(s): GLUCAP in the last 168 hours. Lipid Profile: No results for input(s): CHOL,  HDL, LDLCALC, TRIG, CHOLHDL, LDLDIRECT in the last 72 hours. Thyroid Function Tests: No results for input(s): TSH, T4TOTAL, FREET4, T3FREE, THYROIDAB in the last 72 hours. Anemia Panel: No results for input(s): VITAMINB12, FOLATE, FERRITIN, TIBC, IRON, RETICCTPCT in the last 72 hours. Sepsis Labs: No results for input(s): PROCALCITON, LATICACIDVEN in the last 168 hours.  Recent Results (from the past 240 hour(s))  SARS Coronavirus 2 [facility] order, Performed in [facility] hospital lab) Nasopharyngeal Nasopharyngeal Swab     Status: None   Collection Time: 03/26/19  9:04 PM   Specimen: Nasopharyngeal Swab  Result Value Ref Range Status   SARS Coronavirus 2 NEGATIVE NEGATIVE Final    Comment: (NOTE) If result is NEGATIVE SARS-CoV-2 target nucleic acids are NOT DETECTED. The SARS-CoV-2 RNA is generally detectable in upper and lower  respiratory specimens during the acute phase of infection. The lowest  concentration of SARS-CoV-2 viral copies this assay can detect is 250  copies / mL. A negative result does not preclude SARS-CoV-2 infection  and should not be used as the sole basis for treatment or other  patient management decisions.  A negative result may occur with  improper specimen collection / handling, submission of specimen other  than nasopharyngeal swab, presence of viral mutation(s) within the  areas targeted by this assay, and inadequate number of viral copies  (<250 copies / mL). A negative result must be combined with clinical  observations, patient history, and epidemiological information. If result is POSITIVE SARS-CoV-2 target nucleic acids are DETECTED. The SARS-CoV-2 RNA is generally detectable in upper and lower  respiratory specimens dur ing the acute phase of infection.  Positive  results are indicative of active infection with SARS-CoV-2.  Clinical  correlation with patient history and other diagnostic information is  necessary to determine patient infection  status.  Positive results do  not rule out bacterial infection or co-infection with other viruses. If result is PRESUMPTIVE POSTIVE SARS-CoV-2 nucleic acids MAY BE PRESENT.   A presumptive positive result was obtained on the submitted specimen  and confirmed on repeat testing.  While 2019 novel coronavirus  (SARS-CoV-2) nucleic acids may be present in the submitted sample  additional confirmatory testing may be necessary for epidemiological  and / or clinical management purposes  to differentiate between  SARS-CoV-2 and other Sarbecovirus currently known to infect humans.  If clinically indicated additional testing with an alternate test  methodology [phone]) is advised. The SARS-CoV-2 RNA is generally  detectable in upper and lower respiratory sp ecimens during the acute  phase of infection. The expected result is Negative. Fact Sheet for Patients:  StrictlyIdeas.no Fact Sheet for Healthcare Providers: BankingDealers.co.za This test is not yet approved or cleared by the [place] FDA and has been authorized for detection and/or diagnosis of SARS-CoV-2 by FDA under an Emergency Use Authorization (EUA).  This EUA will remain in effect (meaning this test can be used) for the duration of the COVID-19 declaration under Section 564(b)(1) of the Act, 21 U.S.C. section 360bbb-3(b)(1), unless the authorization is terminated or revoked sooner. Performed at [facility], [address], [place], [place] [address]   MRSA PCR Screening     Status: None   Collection Time: 03/27/19 12:51 AM   Specimen: Nasopharyngeal  Result Value Ref Range Status   MRSA by PCR NEGATIVE NEGATIVE Final    Comment:        The GeneXpert MRSA Assay (FDA approved for NASAL specimens only), is one component of a comprehensive MRSA colonization surveillance program. It is not intended to diagnose MRSA infection nor to guide or monitor treatment for MRSA  infections. Performed at [facility], [address] [place],  [address]          Radiology Studies: Dg Chest 2 View  Result Date: 03/26/2019 CLINICAL DATA:  Chest pain EXAM: CHEST - 2 VIEW COMPARISON:  08/29/2018 FINDINGS: Left-sided single lead pacing device as before. Cardiomegaly with vascular congestion. Diffuse slightly reticular interstitial opacity. No pneumothorax. Linear metallic opacity or wire fragments over the chest. IMPRESSION: 1. Cardiomegaly with vascular congestion 2. Diffuse bilateral interstitial and slightly reticular opacity suggesting component of chronic interstitial lung disease. There may be mild acute superimposed interstitial edema. Electronically Signed   By: Donavan Foil M.D.   On: 03/26/2019 20:27        Scheduled Meds: . carvedilol  25 mg Oral BID AC  . Chlorhexidine Gluconate Cloth  6 each Topical Daily  . diazepam  10 mg Oral BID  . DULoxetine  30 mg Oral Daily  . furosemide  40 mg Oral Daily  . gabapentin  600 mg Oral QID  . lisinopril  40 mg Oral Daily  . mirtazapine  30 mg Oral QHS  . rivaroxaban  20 mg Oral Q supper  .  spironolactone  25 mg Oral Daily   Continuous Infusions: . amiodarone 30 mg/hr (03/27/19 0530)     LOS: 0 days    Time spent: 30 minutes    Sharnise Blough Darleen Crocker, DO [facility] Pager [phone]  If 7PM-7AM, please contact night-coverage [email or web address] Password [email or web address] 03/27/2019, 8:08 AM

## 2019-03-27 NOTE — Discharge Summary (Signed)
Physician Discharge Summary  Vernon Nelson [id number] DOB: 1955-11-16 DOA: 03/26/2019  PCP: Wyatt Haste, NP  Admit date: 03/26/2019  Discharge date: 03/27/2019  Admitted From:Home  Disposition:  Home  Recommendations for Outpatient Follow-up:  1. Follow up with PCP in 1-2 weeks 2. Follow-up with cardiology as scheduled on 10/22 3. Continue on prior home medications to include Coreg as well as spironolactone and Lasix and hold hydralazine and lisinopril due to soft blood pressure readings  Home Health: None  Equipment/Devices: None  Discharge Condition: Stable  CODE STATUS: Full  Diet recommendation: Heart Healthy  Brief/Interim Summary: Per HPI: Vernon Nelson a 63 y.o.malewith medical history significant ofchronic atrial fibrillation, cervical spondylosis, nonalcoholic cirrhosis, COPD on home oxygen, hypertension, morbid obesity, nonischemic cardiomyopathy, chronic combined systolic and diastolic CHF, OSA on CPAP, osteoarthritis of both knees who is coming to the emergency department due tohaving his defibrillator shocking him 6 times. Upon review of his telemetry, he has been having atrial fibrillation with RVR. The patient mentions that he was at the grocery store when he feltmultiple shocks from his AICD. He states that he felt shaky and has some diaphoresis. The last one occurred while he was out the line paying for his groceries. This was so intense that he has bit his tongue. He then went home to rest and felt it there once more. Following this, he decided to come to the emergency department. He denies chest pain, palpitations, dizziness, PND or orthopnea. He occasionally gets lower extremity edema. Denies fever, chills, headache, rhinorrhea, sore throat, wheezing or hemoptysis. He denies abdominal pain, nausea, emesis, diarrhea, melena or hematochezia. He occasionally gets constipation. No dysuria, frequency or hematuria. Denies polyuria, polydipsia,  polyphagia or blurred vision.  10/8: Patient had been started on amiodarone drip per cardiology recommendations and had undergone device reprogramming today.  Apparently, his prior settings were suboptimal and his heart rate is now in the 70 bpm range without any significant arrhythmias or abnormalities.  He is currently asymptomatic and eager to go home and has been approved by cardiology for discharge and close follow-up on 10/22.  He does not require any amiodarone or other medications.  No other acute events noted throughout the course of the stay.  Discharge Diagnoses:  Principal Problem:   Atrial fibrillation with RVR (HCC) Active Problems:   Chronic combined systolic and diastolic heart failure (HCC)   HTN (hypertension)   OSA (obstructive sleep apnea)   Morbid obesity (HCC)   COPD (chronic obstructive pulmonary disease) (HCC)   Cirrhosis, non-alcoholic (HCC)   Tobacco use   Inappropriate shocks from ICD (implantable cardioverter-defibrillator)  Principal discharge diagnosis: Atrial fibrillation with RVR status post several ICD shocks secondary to suboptimal programming.  Discharge Instructions  Discharge Instructions    Amb referral to [facility]   Complete by: As directed    Diet - low sodium heart healthy   Complete by: As directed    Increase activity slowly   Complete by: As directed      Allergies as of 03/27/2019   No Known Allergies     Medication List    STOP taking these medications   hydrALAZINE 25 MG tablet Commonly known as: APRESOLINE   lisinopril 40 MG tablet Commonly known as: ZESTRIL     TAKE these medications   carvedilol 25 MG tablet Commonly known as: COREG Take 25 mg by mouth 2 (two) times daily.   diazepam 10 MG tablet Commonly known as: VALIUM Take 10 mg by mouth 2 (  two) times daily.   DULoxetine 30 MG capsule Commonly known as: CYMBALTA Take 30 mg by mouth daily.   furosemide 40 MG tablet Commonly known as: LASIX Take 40 mg by  mouth daily.   gabapentin 600 MG tablet Commonly known as: NEURONTIN Take 600 mg by mouth 4 (four) times daily.   mirtazapine 30 MG tablet Commonly known as: REMERON Take 30 mg by mouth at bedtime.   rivaroxaban 20 MG Tabs tablet Commonly known as: XARELTO Take 20 mg by mouth daily.   spironolactone 25 MG tablet Commonly known as: ALDACTONE Take 25 mg by mouth daily.   Suboxone 4-1 MG Film Generic drug: Buprenorphine HCl-Naloxone HCl Place 1.5 Film under the tongue 1 day or 1 dose.      Follow-up Information    Herminio Commons, MD Follow up on 04/10/2019.   Specialty: Cardiology Why: Keep scheduled Virutal Visit for 04/10/2019 at 2:20 PM. Contact information: [place] [address] [phone]        Wyatt Haste, NP Follow up in 1 week(s).   Contact information: [address] [place] [address] [phone]          No Known Allergies  Consultations:  Cardiology   Procedures/Studies: Dg Chest 2 View  Result Date: 03/26/2019 CLINICAL DATA:  Chest pain EXAM: CHEST - 2 VIEW COMPARISON:  08/29/2018 FINDINGS: Left-sided single lead pacing device as before. Cardiomegaly with vascular congestion. Diffuse slightly reticular interstitial opacity. No pneumothorax. Linear metallic opacity or wire fragments over the chest. IMPRESSION: 1. Cardiomegaly with vascular congestion 2. Diffuse bilateral interstitial and slightly reticular opacity suggesting component of chronic interstitial lung disease. There may be mild acute superimposed interstitial edema. Electronically Signed   By: Donavan Foil M.D.   On: 03/26/2019 20:27     Discharge Exam: Vitals:   03/27/19 0600 03/27/19 0900  BP: 103/77 (!) 106/93  Pulse:  61  Resp:  14  Temp:    SpO2:  90%   Vitals:   03/27/19 0400 03/27/19 0500 03/27/19 0600 03/27/19 0900  BP: 126/81 (!) 109/94 103/77 (!) 106/93  Pulse: 85 82  61  Resp: 13   14  Temp: 97.6 F (36.4 C)     TempSrc:  Axillary     SpO2: 92% 96%  90%  Weight:  133.7 kg    Height:        General: Pt is alert, awake, not in acute distress Cardiovascular: RRR, S1/S2 +, no rubs, no gallops Respiratory: CTA bilaterally, no wheezing, no rhonchi Abdominal: Soft, NT, ND, bowel sounds + Extremities: no edema, no cyanosis    The results of significant diagnostics from this hospitalization (including imaging, microbiology, ancillary and laboratory) are listed below for reference.     Microbiology: Recent Results (from the past 240 hour(s))  SARS Coronavirus 2 [facility] order, Performed in [facility] hospital lab) Nasopharyngeal Nasopharyngeal Swab     Status: None   Collection Time: 03/26/19  9:04 PM   Specimen: Nasopharyngeal Swab  Result Value Ref Range Status   SARS Coronavirus 2 NEGATIVE NEGATIVE Final    Comment: (NOTE) If result is NEGATIVE SARS-CoV-2 target nucleic acids are NOT DETECTED. The SARS-CoV-2 RNA is generally detectable in upper and lower  respiratory specimens during the acute phase of infection. The lowest  concentration of SARS-CoV-2 viral copies this assay can detect is 250  copies / mL. A negative result does not preclude SARS-CoV-2 infection  and should not  be used as the sole basis for treatment or other  patient management decisions.  A negative result may occur with  improper specimen collection / handling, submission of specimen other  than nasopharyngeal swab, presence of viral mutation(s) within the  areas targeted by this assay, and inadequate number of viral copies  (<250 copies / mL). A negative result must be combined with clinical  observations, patient history, and epidemiological information. If result is POSITIVE SARS-CoV-2 target nucleic acids are DETECTED. The SARS-CoV-2 RNA is generally detectable in upper and lower  respiratory specimens dur ing the acute phase of infection.  Positive  results are indicative of active infection with SARS-CoV-2.  Clinical   correlation with patient history and other diagnostic information is  necessary to determine patient infection status.  Positive results do  not rule out bacterial infection or co-infection with other viruses. If result is PRESUMPTIVE POSTIVE SARS-CoV-2 nucleic acids MAY BE PRESENT.   A presumptive positive result was obtained on the submitted specimen  and confirmed on repeat testing.  While 2019 novel coronavirus  (SARS-CoV-2) nucleic acids may be present in the submitted sample  additional confirmatory testing may be necessary for epidemiological  and / or clinical management purposes  to differentiate between  SARS-CoV-2 and other Sarbecovirus currently known to infect humans.  If clinically indicated additional testing with an alternate test  methodology [phone]) is advised. The SARS-CoV-2 RNA is generally  detectable in upper and lower respiratory sp ecimens during the acute  phase of infection. The expected result is Negative. Fact Sheet for Patients:  StrictlyIdeas.no Fact Sheet for Healthcare Providers: BankingDealers.co.za This test is not yet approved or cleared by the [place] FDA and has been authorized for detection and/or diagnosis of SARS-CoV-2 by FDA under an Emergency Use Authorization (EUA).  This EUA will remain in effect (meaning this test can be used) for the duration of the COVID-19 declaration under Section 564(b)(1) of the Act, 21 U.S.C. section 360bbb-3(b)(1), unless the authorization is terminated or revoked sooner. Performed at [facility], [address], [place], [place] [address]   MRSA PCR Screening     Status: None   Collection Time: 03/27/19 12:51 AM   Specimen: Nasopharyngeal  Result Value Ref Range Status   MRSA by PCR NEGATIVE NEGATIVE Final    Comment:        The GeneXpert MRSA Assay (FDA approved for NASAL specimens only), is one component of a comprehensive MRSA  colonization surveillance program. It is not intended to diagnose MRSA infection nor to guide or monitor treatment for MRSA infections. Performed at [facility], [address], [place],  [address]      Labs: BNP (last 3 results) Recent Labs    03/26/19 2045  BNP 99991111*   Basic Metabolic Panel: Recent Labs  Lab 03/26/19 2045  NA 135  K 4.4  CL 98  CO2 35*  GLUCOSE 178*  BUN 12  CREATININE 1.20  CALCIUM 8.9  MG 2.0   Liver Function Tests: No results for input(s): AST, ALT, ALKPHOS, BILITOT, PROT, ALBUMIN in the last 168 hours. No results for input(s): LIPASE, AMYLASE in the last 168 hours. No results for input(s): AMMONIA in the last 168 hours. CBC: Recent Labs  Lab 03/26/19 2045 03/27/19 0434  WBC 10.3 7.8  HGB 13.3 13.4  HCT 41.9 42.7  MCV 97.0 96.4  PLT 304 289   Cardiac Enzymes: No results for input(s): CKTOTAL, CKMB, CKMBINDEX, TROPONINI in the last 168 hours. BNP: Invalid  input(s): POCBNP CBG: No results for input(s): GLUCAP in the last 168 hours. D-Dimer No results for input(s): DDIMER in the last 72 hours. Hgb A1c No results for input(s): HGBA1C in the last 72 hours. Lipid Profile No results for input(s): CHOL, HDL, LDLCALC, TRIG, CHOLHDL, LDLDIRECT in the last 72 hours. Thyroid function studies No results for input(s): TSH, T4TOTAL, T3FREE, THYROIDAB in the last 72 hours.  Invalid input(s): FREET3 Anemia work up No results for input(s): VITAMINB12, FOLATE, FERRITIN, TIBC, IRON, RETICCTPCT in the last 72 hours. Urinalysis No results found for: COLORURINE, APPEARANCEUR, LABSPEC, [place], GLUCOSEU, [place], [place], KETONESUR, PROTEINUR, UROBILINOGEN, NITRITE, LEUKOCYTESUR Sepsis Labs Invalid input(s): PROCALCITONIN,  WBC,  [place] Microbiology Recent Results (from the past 240 hour(s))  SARS Coronavirus 2 [facility] order, Performed in [facility] hospital lab) Nasopharyngeal Nasopharyngeal Swab     Status: None   Collection  Time: 03/26/19  9:04 PM   Specimen: Nasopharyngeal Swab  Result Value Ref Range Status   SARS Coronavirus 2 NEGATIVE NEGATIVE Final    Comment: (NOTE) If result is NEGATIVE SARS-CoV-2 target nucleic acids are NOT DETECTED. The SARS-CoV-2 RNA is generally detectable in upper and lower  respiratory specimens during the acute phase of infection. The lowest  concentration of SARS-CoV-2 viral copies this assay can detect is 250  copies / mL. A negative result does not preclude SARS-CoV-2 infection  and should not be used as the sole basis for treatment or other  patient management decisions.  A negative result may occur with  improper specimen collection / handling, submission of specimen other  than nasopharyngeal swab, presence of viral mutation(s) within the  areas targeted by this assay, and inadequate number of viral copies  (<250 copies / mL). A negative result must be combined with clinical  observations, patient history, and epidemiological information. If result is POSITIVE SARS-CoV-2 target nucleic acids are DETECTED. The SARS-CoV-2 RNA is generally detectable in upper and lower  respiratory specimens dur ing the acute phase of infection.  Positive  results are indicative of active infection with SARS-CoV-2.  Clinical  correlation with patient history and other diagnostic information is  necessary to determine patient infection status.  Positive results do  not rule out bacterial infection or co-infection with other viruses. If result is PRESUMPTIVE POSTIVE SARS-CoV-2 nucleic acids MAY BE PRESENT.   A presumptive positive result was obtained on the submitted specimen  and confirmed on repeat testing.  While 2019 novel coronavirus  (SARS-CoV-2) nucleic acids may be present in the submitted sample  additional confirmatory testing may be necessary for epidemiological  and / or clinical management purposes  to differentiate between  SARS-CoV-2 and other Sarbecovirus currently known  to infect humans.  If clinically indicated additional testing with an alternate test  methodology [phone]) is advised. The SARS-CoV-2 RNA is generally  detectable in upper and lower respiratory sp ecimens during the acute  phase of infection. The expected result is Negative. Fact Sheet for Patients:  StrictlyIdeas.no Fact Sheet for Healthcare Providers: BankingDealers.co.za This test is not yet approved or cleared by the [place] FDA and has been authorized for detection and/or diagnosis of SARS-CoV-2 by FDA under an Emergency Use Authorization (EUA).  This EUA will remain in effect (meaning this test can be used) for the duration of the COVID-19 declaration under Section 564(b)(1) of the Act, 21 U.S.C. section 360bbb-3(b)(1), unless the authorization is terminated or revoked sooner. Performed at [facility], [address] [place], [place] [address]   MRSA PCR  Screening     Status: None   Collection Time: 03/27/19 12:51 AM   Specimen: Nasopharyngeal  Result Value Ref Range Status   MRSA by PCR NEGATIVE NEGATIVE Final    Comment:        The GeneXpert MRSA Assay (FDA approved for NASAL specimens only), is one component of a comprehensive MRSA colonization surveillance program. It is not intended to diagnose MRSA infection nor to guide or monitor treatment for MRSA infections. Performed at [facility], [address], [place], [place] [address]      Time coordinating discharge: 40 minutes  SIGNED:   Rodena Goldmann, DO [facility] 03/27/2019, 10:20 AM  If 7PM-7AM, please contact night-coverage [email or web address] Password [email or web address]

## 2019-03-28 ENCOUNTER — Other Ambulatory Visit (HOSPITAL_COMMUNITY)
Admission: RE | Admit: 2019-03-28 | Discharge: 2019-03-28 | Disposition: A | Discharge status: Home / Self Care | Payer: Medicare Other | Source: Ambulatory Visit | Attending: Neurology | Admitting: Neurology

## 2019-03-28 ENCOUNTER — Ambulatory Visit (INDEPENDENT_AMBULATORY_CARE_PROVIDER_SITE_OTHER): Payer: Medicare Other | Admitting: *Deleted

## 2019-03-28 ENCOUNTER — Other Ambulatory Visit: Payer: Self-pay

## 2019-03-28 DIAGNOSIS — I4891 Unspecified atrial fibrillation: Secondary | ICD-10-CM | POA: Diagnosis not present

## 2019-03-28 DIAGNOSIS — I5042 Chronic combined systolic (congestive) and diastolic (congestive) heart failure: Secondary | ICD-10-CM

## 2019-03-28 LAB — CUP PACEART REMOTE DEVICE CHECK
Date Time Interrogation Session: 20201009104958
Date Time Interrogation Session: 20201009110433
Implantable Lead Implant Date: 20160808
Implantable Lead Implant Date: 20160808
Implantable Lead Location: 753860
Implantable Lead Location: 753860
Implantable Lead Serial Number: 10594105
Implantable Lead Serial Number: 10594105
Implantable Pulse Generator Implant Date: 20160808
Implantable Pulse Generator Implant Date: 20160808
Pulse Gen Model: 393033
Pulse Gen Model: 393033
Pulse Gen Serial Number: 60860896
Pulse Gen Serial Number: 60860896

## 2019-03-31 ENCOUNTER — Telehealth: Payer: Self-pay

## 2019-03-31 ENCOUNTER — Other Ambulatory Visit: Payer: Self-pay

## 2019-03-31 NOTE — Telephone Encounter (Signed)
Pt states he went to the hospital last week. He called to verify his appointment with Dr. Rayann Heman in November. He spoke with the device nurse [place].

## 2019-03-31 NOTE — Telephone Encounter (Signed)
Called to confirm when his f/u appointment is with Dr Rayann Heman. Appointment 05/14/19 in [place] with Dr Rayann Heman.. Explained to patient that dr Rayann Heman may want to move appointment up due to the patients recent hospitalization. No c/o issue at this time. Patient declined an appointment before November. Patient has sleep study scheduled for 04/01/2019.Educated on shock plan and ED precautions.

## 2019-04-01 ENCOUNTER — Other Ambulatory Visit (HOSPITAL_BASED_OUTPATIENT_CLINIC_OR_DEPARTMENT_OTHER): Payer: Self-pay

## 2019-04-01 ENCOUNTER — Ambulatory Visit: Payer: Medicare Other

## 2019-04-09 NOTE — Progress Notes (Signed)
Remote ICD transmission.   

## 2019-04-10 ENCOUNTER — Telehealth: Payer: Medicare Other | Admitting: Cardiovascular Disease

## 2019-04-22 DIAGNOSIS — M542 Cervicalgia: Secondary | ICD-10-CM | POA: Diagnosis not present

## 2019-04-22 DIAGNOSIS — M545 Low back pain: Secondary | ICD-10-CM | POA: Diagnosis not present

## 2019-04-22 DIAGNOSIS — M130 Polyarthritis, unspecified: Secondary | ICD-10-CM | POA: Diagnosis not present

## 2019-04-22 DIAGNOSIS — Z79891 Long term (current) use of opiate analgesic: Secondary | ICD-10-CM | POA: Diagnosis not present

## 2019-04-22 DIAGNOSIS — G894 Chronic pain syndrome: Secondary | ICD-10-CM | POA: Diagnosis not present

## 2019-05-07 ENCOUNTER — Telehealth: Payer: Self-pay | Admitting: *Deleted

## 2019-05-07 NOTE — Telephone Encounter (Signed)
Patient called in, reports he is returning call. Advised that no one in the [facility] called him today. Appears that [facility] office staff may be trying to reschedule 11/25 appointment to virtual. Advised patient I will forward message to [facility] staff to call him back. Patient verbalizes understanding.

## 2019-05-07 NOTE — Telephone Encounter (Signed)
Virtual Visit Pre-Appointment Phone Call  "(Name), I am calling you today to discuss your upcoming appointment. We are currently trying to limit exposure to the virus that causes COVID-19 by seeing patients at home rather than in the office."  1. "What is the BEST phone number to call the day of the visit?" - include this in appointment notes  2. Do you have or have access to (through a family member/friend) a smartphone with video capability that we can use for your visit?" a. If yes - list this number in appt notes as cell (if different from BEST phone #) and list the appointment type as a VIDEO visit in appointment notes b. If no - list the appointment type as a PHONE visit in appointment notes  3. Confirm consent - "In the setting of the current Covid19 crisis, you are scheduled for a (phone or video) visit with your provider on (date) at (time).  Just as we do with many in-office visits, in order for you to participate in this visit, we must obtain consent.  If you'd like, I can send this to your mychart (if signed up) or email for you to review.  Otherwise, I can obtain your verbal consent now.  All virtual visits are billed to your insurance company just like a normal visit would be.  By agreeing to a virtual visit, we'd like you to understand that the technology does not allow for your provider to perform an examination, and thus may limit your provider's ability to fully assess your condition. If your provider identifies any concerns that need to be evaluated in person, we will make arrangements to do so.  Finally, though the technology is pretty good, we cannot assure that it will always work on either your or our end, and in the setting of a video visit, we may have to convert it to a phone-only visit.  In either situation, we cannot ensure that we have a secure connection.  Are you willing to proceed?" STAFF: Did the patient verbally acknowledge consent to telehealth visit? Document  YES/NO here: YES   4. Advise patient to be prepared - "Two hours prior to your appointment, go ahead and check your blood pressure, pulse, oxygen saturation, and your weight (if you have the equipment to check those) and write them all down. When your visit starts, your provider will ask you for this information. If you have an Apple Watch or Kardia device, please plan to have heart rate information ready on the day of your appointment. Please have a pen and paper handy nearby the day of the visit as well."  5. Give patient instructions for MyChart download to smartphone OR Doximity/Doxy.me as below if video visit (depending on what platform provider is using)  6. Inform patient they will receive a phone call 15 minutes prior to their appointment time (may be from unknown caller ID) so they should be prepared to answer    [facility] has been deemed a candidate for a follow-up tele-health visit to limit community exposure during the Covid-19 pandemic. I spoke with the patient via phone to ensure availability of phone/video source, confirm preferred email & phone number, and discuss instructions and expectations.  I reminded [place] Vernon Nelson to be prepared with any vital sign and/or heart rhythm information that could potentially be obtained via home monitoring, at the time of his visit. I reminded Vernon Nelson to expect a phone call prior to his visit.  Vernon Nelson 05/07/2019 3:12 PM   INSTRUCTIONS FOR DOWNLOADING THE MYCHART APP TO SMARTPHONE  - The patient must first make sure to have activated MyChart and know their login information - If Apple, go to [employer] and type in MyChart in the search bar and download the app. If Android, ask patient to go to [employer] and type in [place] in the search bar and download the app. The app is free but as with any other app downloads, their phone may require them to verify saved payment information or Apple/Android  password.  - The patient will need to then log into the app with their MyChart username and password, and select [facility] as their healthcare provider to link the account. When it is time for your visit, go to the MyChart app, find appointments, and click Begin Video Visit. Be sure to Select Allow for your device to access the Microphone and Camera for your visit. You will then be connected, and your provider will be with you shortly.  **If they have any issues connecting, or need assistance please contact MyChart service desk [phone])**  **If using a computer, in order to ensure the best quality for their visit they will need to use either of the following Internet Browsers: [employer], or Google Chrome**  IF USING DOXIMITY or DOXY.ME - The patient will receive a link just prior to their visit by text.     FULL LENGTH CONSENT FOR TELE-HEALTH VISIT   I hereby voluntarily request, consent and authorize [facility] and its employed or contracted physicians, physician assistants, nurse practitioners or other licensed health care professionals (the Practitioner), to provide me with telemedicine health care services (the Services") as deemed necessary by the treating Practitioner. I acknowledge and consent to receive the Services by the Practitioner via telemedicine. I understand that the telemedicine visit will involve communicating with the Practitioner through live audiovisual communication technology and the disclosure of certain medical information by electronic transmission. I acknowledge that I have been given the opportunity to request an in-person assessment or other available alternative prior to the telemedicine visit and am voluntarily participating in the telemedicine visit.  I understand that I have the right to withhold or withdraw my consent to the use of telemedicine in the course of my care at any time, without affecting my right to future care or treatment,  and that the Practitioner or I may terminate the telemedicine visit at any time. I understand that I have the right to inspect all information obtained and/or recorded in the course of the telemedicine visit and may receive copies of available information for a reasonable fee.  I understand that some of the potential risks of receiving the Services via telemedicine include:   Delay or interruption in medical evaluation due to technological equipment failure or disruption;  Information transmitted may not be sufficient (e.g. poor resolution of images) to allow for appropriate medical decision making by the Practitioner; and/or   In rare instances, security protocols could fail, causing a breach of personal health information.  Furthermore, I acknowledge that it is my responsibility to provide information about my medical history, conditions and care that is complete and accurate to the best of my ability. I acknowledge that Practitioner's advice, recommendations, and/or decision may be based on factors not within their control, such as incomplete or inaccurate data provided by me or distortions of diagnostic images or specimens that may result from electronic transmissions. I understand that the  practice of medicine is not an Chief Strategy Officer and that Practitioner makes no warranties or guarantees regarding treatment outcomes. I acknowledge that I will receive a copy of this consent concurrently upon execution via email to the email address I last provided but may also request a printed copy by calling the office of [facility].    I understand that my insurance will be billed for this visit.   I have read or had this consent read to me.  I understand the contents of this consent, which adequately explains the benefits and risks of the Services being provided via telemedicine.   I have been provided ample opportunity to ask questions regarding this consent and the Services and have had my questions  answered to my satisfaction.  I give my informed consent for the services to be provided through the use of telemedicine in my medical care  By participating in this telemedicine visit I agree to the above.

## 2019-05-14 ENCOUNTER — Encounter: Payer: Self-pay | Admitting: Internal Medicine

## 2019-05-14 ENCOUNTER — Telehealth (INDEPENDENT_AMBULATORY_CARE_PROVIDER_SITE_OTHER): Payer: Medicare Other | Admitting: Internal Medicine

## 2019-05-14 VITALS — Ht 77.0 in | Wt 289.0 lb

## 2019-05-14 DIAGNOSIS — I428 Other cardiomyopathies: Secondary | ICD-10-CM | POA: Diagnosis not present

## 2019-05-14 DIAGNOSIS — I4819 Other persistent atrial fibrillation: Secondary | ICD-10-CM

## 2019-05-14 DIAGNOSIS — I10 Essential (primary) hypertension: Secondary | ICD-10-CM

## 2019-05-14 DIAGNOSIS — G4733 Obstructive sleep apnea (adult) (pediatric): Secondary | ICD-10-CM | POA: Diagnosis not present

## 2019-05-14 MED ORDER — CARVEDILOL 25 MG PO TABS: 25.0000 mg | ORAL_TABLET | Freq: Two times a day (BID) | ORAL | 3 refills | Status: AC

## 2019-05-14 NOTE — Addendum Note (Signed)
Addended by: Laurine Blazer on: 05/14/2019 11:53 AM   Modules accepted: Orders

## 2019-05-14 NOTE — Progress Notes (Signed)
Electrophysiology TeleHealth Note   Due to national recommendations of social distancing due to [place] 19, an audio telehealth visit is felt to be most appropriate for this patient at this time.  Verbal consent was obtained by me for the telehealth visit today.  The patient does not have capability for a virtual visit.  A phone visit is therefore required today.   Date:  05/14/2019   ID:  Vernon Nelson, DOB Jun 17, 1956, MRN [id number]  Location: patient's home  Provider location:  [facility]  Evaluation Performed: Follow-up visit  PCP:  Wyatt Haste, NP   Electrophysiologist:  Dr Rayann Heman  Chief Complaint:  ICD follow up  History of Present Illness:    Vernon Nelson is a 63 y.o. male who presents via telehealth conferencing today.  Since last being seen in our clinic, the patient reports doing very well.  Today, he denies symptoms of palpitations, chest pain, shortness of breath,  lower extremity edema, dizziness, presyncope, or syncope.  The patient is otherwise without complaint today.  The patient denies symptoms of fevers, chills, cough, or new SOB worrisome for COVID 19.  Past Medical History:  Diagnosis Date  . Atrial fibrillation ([facility])   . Cervical spondylolysis   . Cirrhosis, non-alcoholic ([facility])   . COPD (chronic obstructive pulmonary disease) ([facility])    on home O2  . HTN (hypertension)   . Morbid obesity ([place])   . NICM (nonischemic cardiomyopathy) ([facility])    a. normal cors by cath in 2016, EF 35-40% by echo in 2017, s/p ICD placement  . OSA (obstructive sleep apnea)    uses CPAP  . Osteoarthritis of both knees     Past Surgical History:  Procedure Laterality Date  . ICD IMPLANT  2015/2016   [facility] DR MCQUEEN ([place])    Current Outpatient Medications  Medication Sig Dispense Refill  . Buprenorphine HCl-Naloxone HCl (SUBOXONE) 4-1 MG FILM Place 1.5 Film under the tongue 1 day or 1 dose.    . carvedilol (COREG) 25 MG tablet Take 25 mg by  mouth 2 (two) times daily.    . diazepam (VALIUM) 10 MG tablet Take 10 mg by mouth 2 (two) times daily.    . DULoxetine (CYMBALTA) 30 MG capsule Take 30 mg by mouth daily.     . furosemide (LASIX) 40 MG tablet Take 40 mg by mouth 2 (two) times daily.     Marland Kitchen gabapentin (NEURONTIN) 600 MG tablet Take 600 mg by mouth 4 (four) times daily.     . mirtazapine (REMERON) 30 MG tablet Take 30 mg by mouth at bedtime.    . rivaroxaban (XARELTO) 20 MG TABS tablet Take 20 mg by mouth daily.    Marland Kitchen spironolactone (ALDACTONE) 25 MG tablet Take 25 mg by mouth daily.     No current facility-administered medications for this visit.     Allergies:   Patient has no known allergies.   Social History:  The patient  reports that he has been smoking cigarettes. He has smoked for the past 20.00 years. He has never used smokeless tobacco. He reports that he does not drink alcohol or use drugs.   Family History:  The patient's family history includes Kidney disease in his father.   ROS:  Please see the history of present illness.   All other systems are personally reviewed and negative.    Exam:    Vital Signs:  Ht 6\' 5"  (1.956 m)   Wt 289 lb (  131.1 kg)   BMI 34.27 kg/m   Well sounding, alert and conversant, regular work of breathing  Labs/Other Tests and Data Reviewed:    Recent Labs: 03/26/2019: B Natriuretic Peptide 371.0; BUN 12; Creatinine, Ser 1.20; Magnesium 2.0; Potassium 4.4; Sodium 135 03/27/2019: Hemoglobin 13.4; Platelets 289   Wt Readings from Last 3 Encounters:  05/14/19 289 lb (131.1 kg)  03/27/19 294 lb 12.1 oz (133.7 kg)  02/13/19 278 lb (126.1 kg)     Last device remote is reviewed from [facility] PDF which reveals normal device function   ASSESSMENT & PLAN:    1.  NICM/chronic systolic heart failure Euvolemic by symptoms Normal device function See recent [facility] report  2.  Persistent atrial fibrillation Burden difficult to determine by device interrogation (single chamber ICD)  He received inappropriate shocks in October for AF with RVR Zones adjusted to VT zone 200bpm, VF zone 222 bpm.  Pt thinks he may have missed some Coreg doses prior to shocks - compliance recommended, refill sent in today LA size 5.1 - I think our ability to maintain SR is decreased. He reports failed cardioversion in the past.  Plan rate control for now  3.  Obesity Lifestyle modification encouraged  4.  OSA Compliant with CPAP  5.  Tobacco abuse Cessation advised     Follow-up:  4 months in [place] office    Patient Risk:  after full review of this patients clinical status, I feel that they are at moderate risk at this time.  Today, I have spent 15 minutes with the patient with telehealth technology discussing arrhythmia management .    Army Fossa, MD  05/14/2019 11:38 AM     [place]  [place] [address] [phone] (office) [phone] (fax)

## 2019-05-14 NOTE — Patient Instructions (Signed)
Medication Instructions:   Coreg refilled today - sent to [facility] .  Continue all other medications.    Labwork: none  Testing/Procedures: none  Follow-Up: 4 months   Any Other Special Instructions Will Be Listed Below (If Applicable).  If you need a refill on your cardiac medications before your next appointment, please call your pharmacy.

## 2019-05-20 ENCOUNTER — Other Ambulatory Visit: Payer: Self-pay

## 2019-05-20 ENCOUNTER — Other Ambulatory Visit (HOSPITAL_COMMUNITY)
Admission: RE | Admit: 2019-05-20 | Discharge: 2019-05-20 | Disposition: A | Discharge status: Home / Self Care | Payer: Medicare Other | Source: Ambulatory Visit | Attending: Neurology | Admitting: Neurology

## 2019-05-20 DIAGNOSIS — M542 Cervicalgia: Secondary | ICD-10-CM | POA: Diagnosis not present

## 2019-05-20 DIAGNOSIS — M130 Polyarthritis, unspecified: Secondary | ICD-10-CM | POA: Diagnosis not present

## 2019-05-20 DIAGNOSIS — Z79891 Long term (current) use of opiate analgesic: Secondary | ICD-10-CM | POA: Diagnosis not present

## 2019-05-20 DIAGNOSIS — G894 Chronic pain syndrome: Secondary | ICD-10-CM | POA: Diagnosis not present

## 2019-05-20 DIAGNOSIS — M545 Low back pain: Secondary | ICD-10-CM | POA: Diagnosis not present

## 2019-05-21 ENCOUNTER — Other Ambulatory Visit: Payer: Self-pay

## 2019-05-21 ENCOUNTER — Other Ambulatory Visit (HOSPITAL_COMMUNITY)
Admission: RE | Admit: 2019-05-21 | Discharge: 2019-05-21 | Disposition: A | Discharge status: Home / Self Care | Payer: Medicare Other | Source: Ambulatory Visit | Attending: Neurology | Admitting: Neurology

## 2019-05-27 ENCOUNTER — Other Ambulatory Visit: Payer: Self-pay

## 2019-05-27 ENCOUNTER — Other Ambulatory Visit (HOSPITAL_COMMUNITY)
Admission: RE | Admit: 2019-05-27 | Discharge: 2019-05-27 | Disposition: A | Discharge status: Home / Self Care | Payer: Medicare Other | Source: Ambulatory Visit | Attending: Neurology | Admitting: Neurology

## 2019-06-11 ENCOUNTER — Telehealth: Payer: Medicare Other | Admitting: Cardiovascular Disease

## 2019-06-11 ENCOUNTER — Other Ambulatory Visit: Payer: Self-pay

## 2019-06-16 ENCOUNTER — Other Ambulatory Visit (HOSPITAL_COMMUNITY)
Admission: RE | Admit: 2019-06-16 | Discharge: 2019-06-16 | Disposition: A | Discharge status: Home / Self Care | Payer: Medicare Other | Source: Ambulatory Visit | Attending: Neurology | Admitting: Neurology

## 2019-06-16 ENCOUNTER — Other Ambulatory Visit: Payer: Self-pay

## 2019-06-20 DEATH — deceased

## 2019-06-27 ENCOUNTER — Ambulatory Visit (INDEPENDENT_AMBULATORY_CARE_PROVIDER_SITE_OTHER): Payer: Medicare Other | Admitting: *Deleted

## 2019-06-27 DIAGNOSIS — I4891 Unspecified atrial fibrillation: Secondary | ICD-10-CM | POA: Diagnosis not present

## 2019-06-27 LAB — CUP PACEART REMOTE DEVICE CHECK
Date Time Interrogation Session: 20210108111150
Implantable Lead Implant Date: 20160808
Implantable Lead Location: 753860
Implantable Lead Serial Number: 10594105
Implantable Pulse Generator Implant Date: 20160808
Pulse Gen Model: 393033
Pulse Gen Serial Number: 60860896

## 2019-10-03 ENCOUNTER — Encounter: Payer: Medicare Other | Admitting: Internal Medicine

## 2020-03-27 IMAGING — DX CHEST - 2 VIEW
2 series · 2 of 2 positions shown · non-contrast
Comparison: None.

CLINICAL DATA: Shortness of breath for 3 weeks.

EXAM:
CHEST - 2 VIEW

[chest pa]
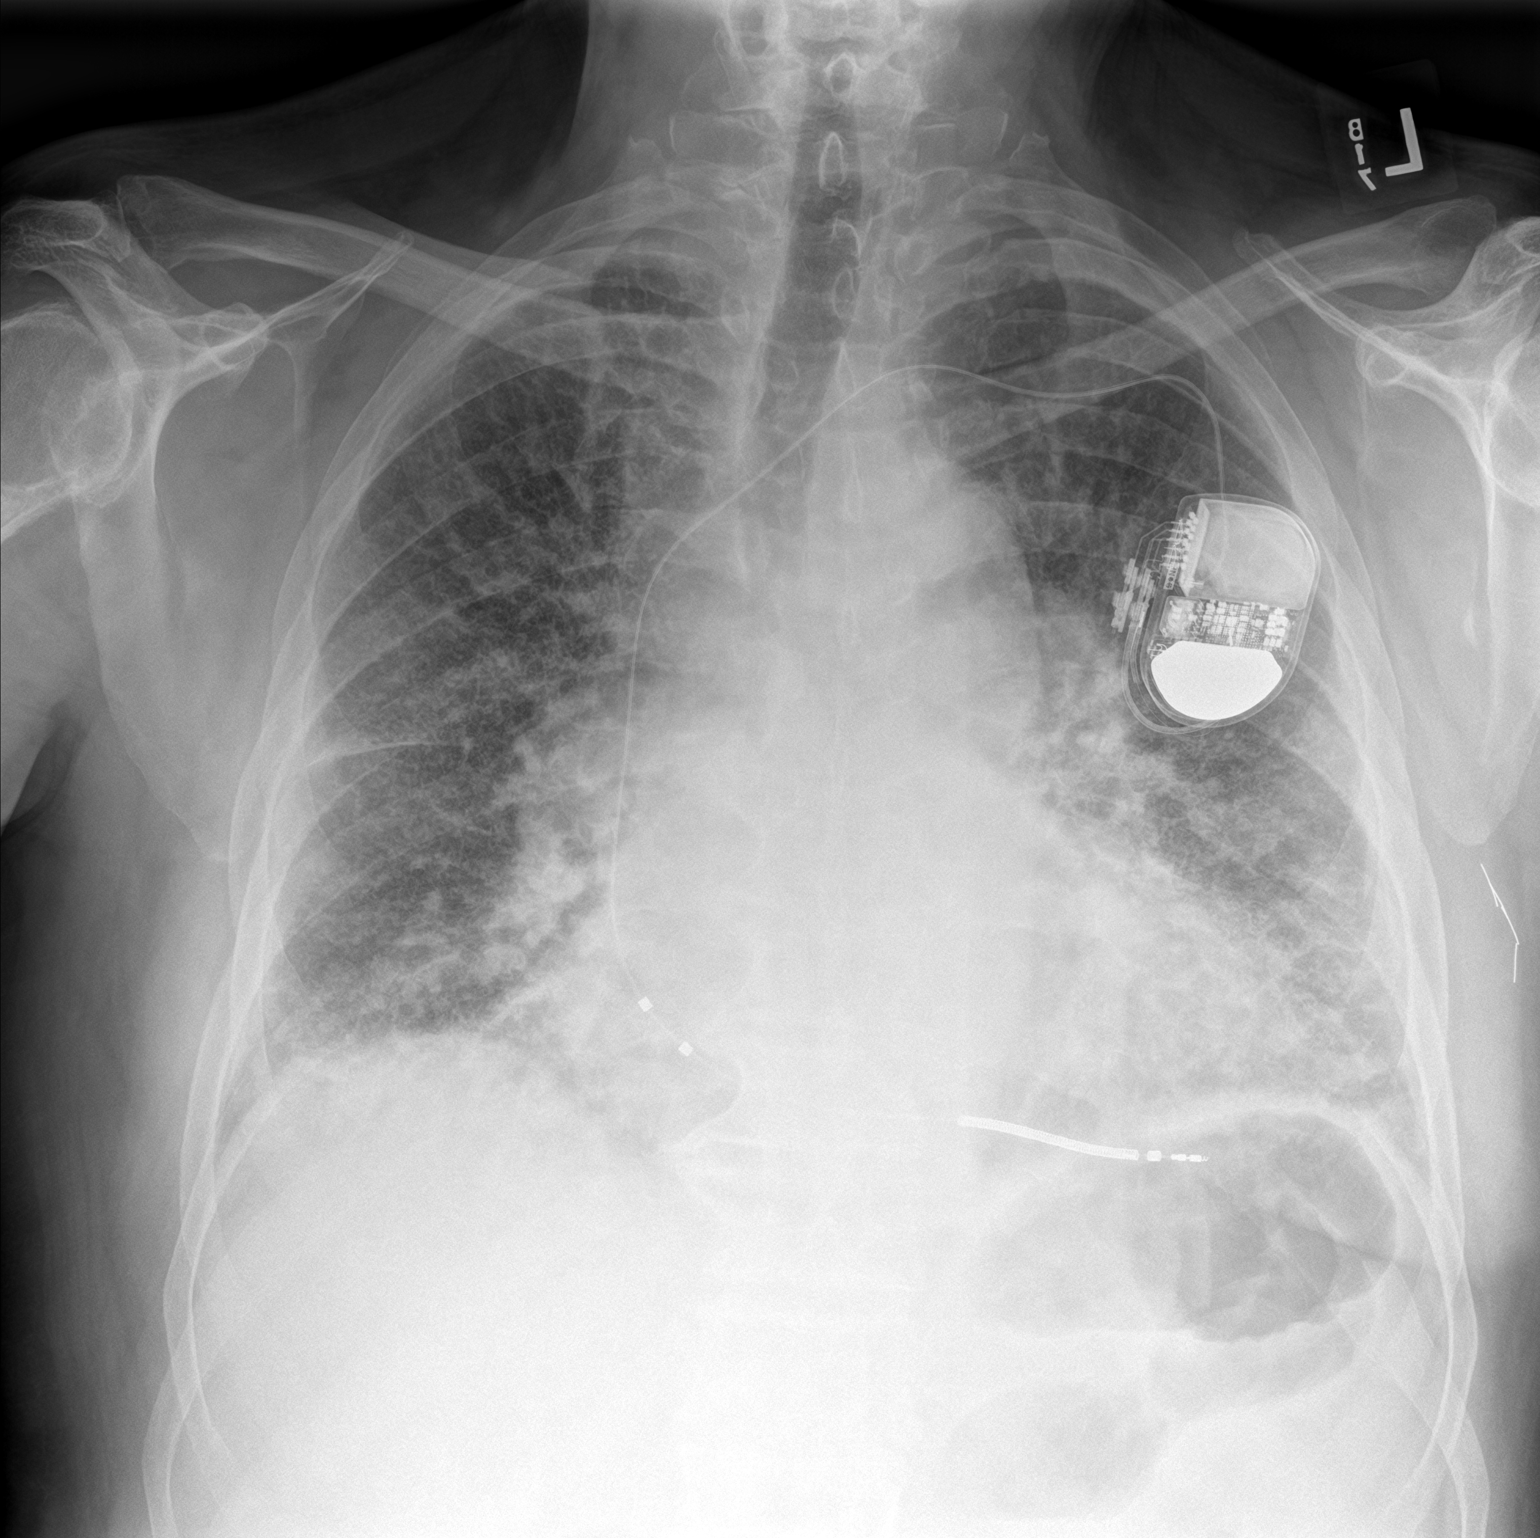

[chest lat]
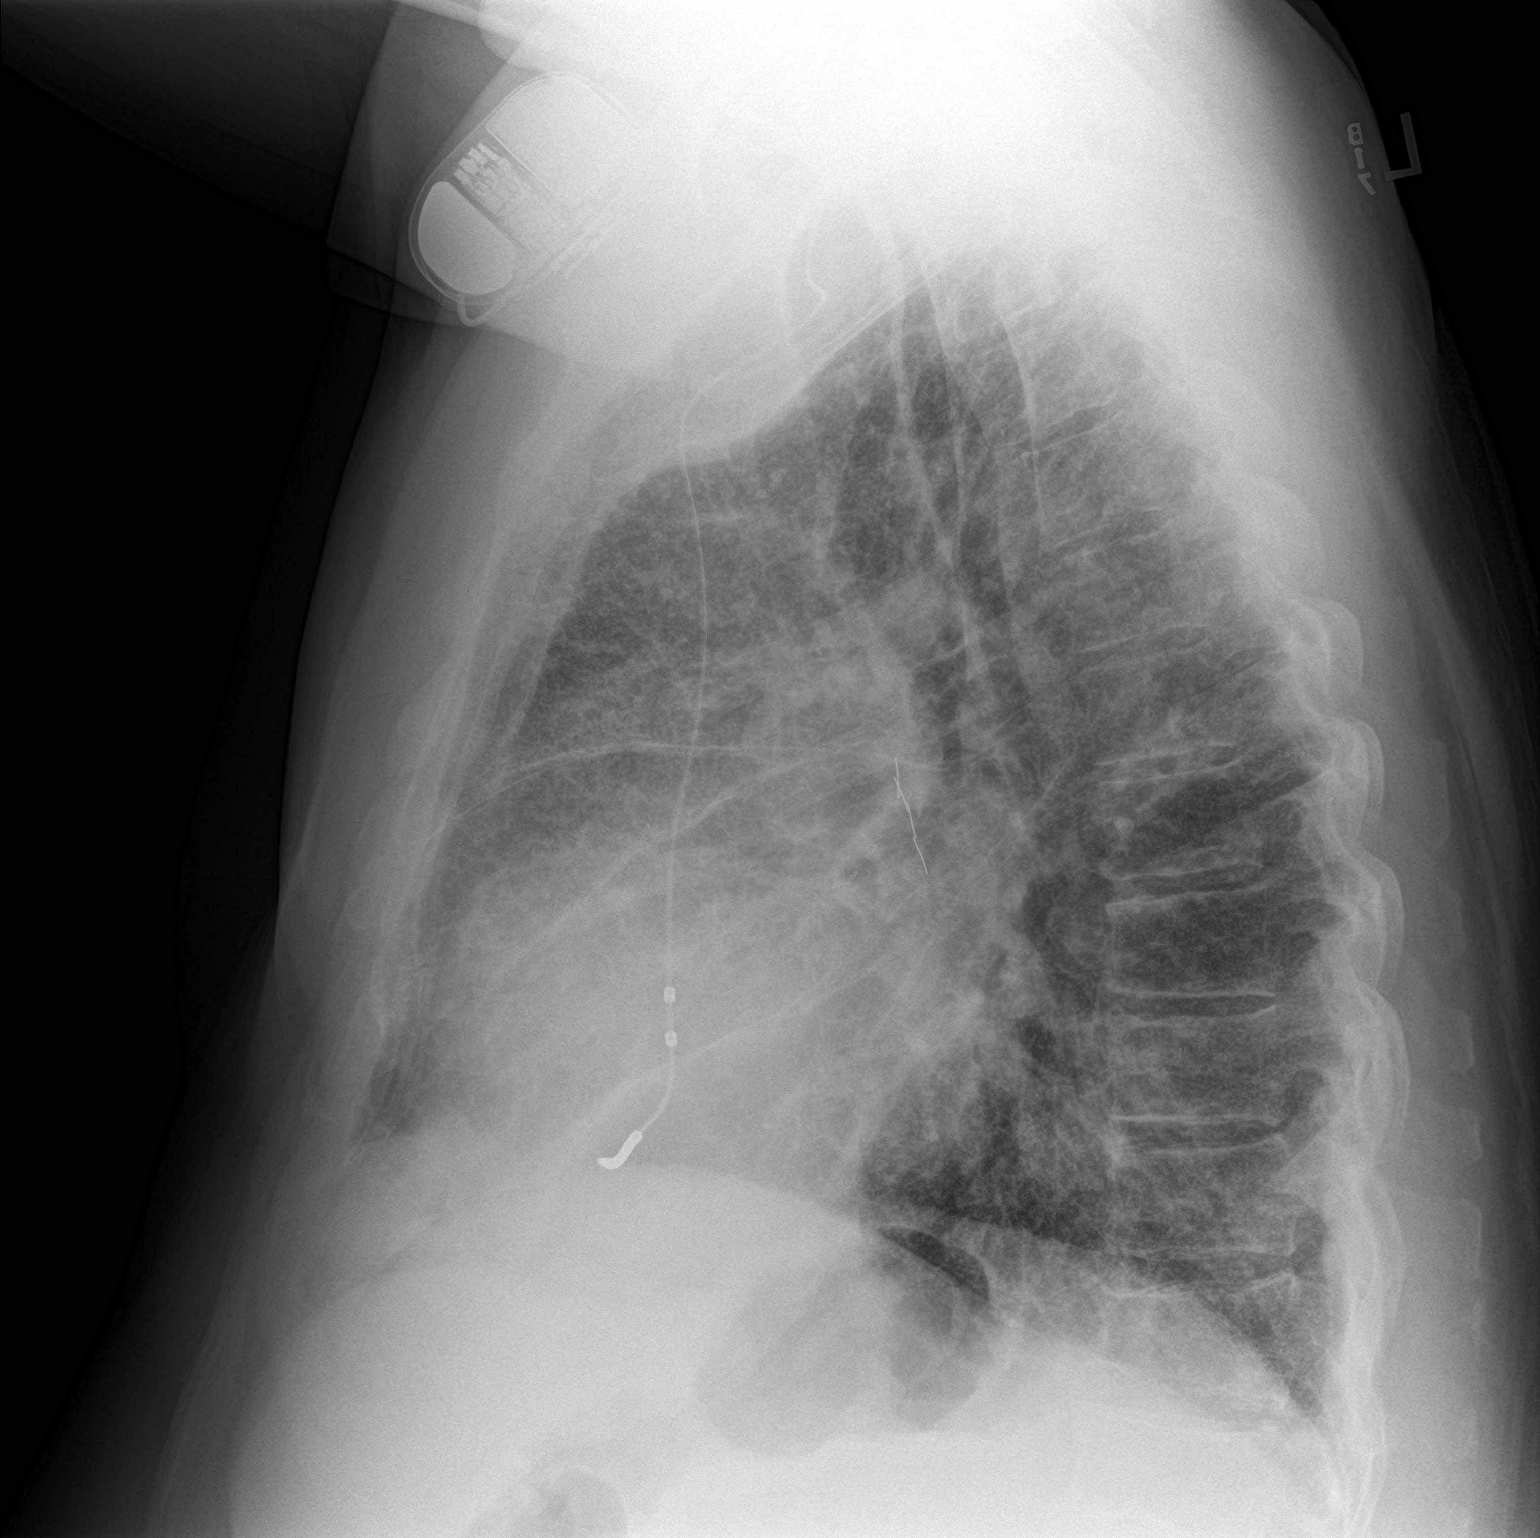

[2 of 2 positions shown; findings below may reference images not displayed]

FINDINGS: Cardiomegaly and LEFT pacemaker/ICD noted.

Diffuse bilateral interstitial and possible airspace opacities
noted.

No pleural effusion or pneumothorax.

No acute bony abnormalities are identified.
IMPRESSION: 1. Diffuse bilateral interstitial and possible airspace opacities,
favor edema over infection, but correlate clinically.
2. Cardiomegaly with pulmonary vascular congestion.
